# Patient Record
Sex: Female | Born: 1980
Health system: Southern US, Community
[De-identification: ages and names within clinical notes are randomized; demographics above are authoritative.]

## PROBLEM LIST (undated history)

## (undated) DIAGNOSIS — R87619 Unspecified abnormal cytological findings in specimens from cervix uteri: Secondary | ICD-10-CM

## (undated) DIAGNOSIS — B977 Papillomavirus as the cause of diseases classified elsewhere: Secondary | ICD-10-CM

## (undated) HISTORY — DX: Unspecified abnormal cytological findings in specimens from cervix uteri: R87.619

## (undated) HISTORY — DX: Papillomavirus as the cause of diseases classified elsewhere: B97.7

---

## 2004-01-11 ENCOUNTER — Emergency Department (HOSPITAL_COMMUNITY): Admission: EM | Admit: 2004-01-11 | Discharge: 2004-01-11 | Payer: Self-pay | Admitting: Emergency Medicine

## 2006-10-19 ENCOUNTER — Emergency Department (HOSPITAL_COMMUNITY): Admission: EM | Admit: 2006-10-19 | Discharge: 2006-10-19 | Payer: Self-pay | Admitting: Emergency Medicine

## 2007-06-27 ENCOUNTER — Other Ambulatory Visit: Admission: RE | Admit: 2007-06-27 | Discharge: 2007-06-27 | Payer: Self-pay | Admitting: Gynecology

## 2007-10-12 ENCOUNTER — Other Ambulatory Visit: Admission: RE | Admit: 2007-10-12 | Discharge: 2007-10-12 | Payer: Self-pay | Admitting: Gynecology

## 2007-11-18 DIAGNOSIS — B977 Papillomavirus as the cause of diseases classified elsewhere: Secondary | ICD-10-CM

## 2007-11-18 HISTORY — PX: GYNECOLOGIC CRYOSURGERY: SHX857

## 2007-11-18 HISTORY — DX: Papillomavirus as the cause of diseases classified elsewhere: B97.7

## 2008-02-22 ENCOUNTER — Other Ambulatory Visit: Admission: RE | Admit: 2008-02-22 | Discharge: 2008-02-22 | Payer: Self-pay | Admitting: Gynecology

## 2008-04-23 ENCOUNTER — Ambulatory Visit: Payer: Self-pay | Admitting: Gastroenterology

## 2008-04-23 DIAGNOSIS — K5900 Constipation, unspecified: Secondary | ICD-10-CM | POA: Insufficient documentation

## 2008-04-23 DIAGNOSIS — D649 Anemia, unspecified: Secondary | ICD-10-CM | POA: Insufficient documentation

## 2008-04-23 DIAGNOSIS — K625 Hemorrhage of anus and rectum: Secondary | ICD-10-CM | POA: Insufficient documentation

## 2008-04-24 LAB — CONVERTED CEMR LAB: TSH: 0.47 microintl units/mL (ref 0.35–5.50)

## 2008-07-03 ENCOUNTER — Encounter: Payer: Self-pay | Admitting: Gynecology

## 2008-07-03 ENCOUNTER — Other Ambulatory Visit: Admission: RE | Admit: 2008-07-03 | Discharge: 2008-07-03 | Payer: Self-pay | Admitting: Gynecology

## 2008-07-03 ENCOUNTER — Ambulatory Visit: Payer: Self-pay | Admitting: Gynecology

## 2008-07-10 ENCOUNTER — Ambulatory Visit: Payer: Self-pay | Admitting: Gynecology

## 2008-08-07 ENCOUNTER — Ambulatory Visit: Payer: Self-pay | Admitting: Gynecology

## 2008-10-02 ENCOUNTER — Ambulatory Visit: Payer: Self-pay | Admitting: Gynecology

## 2008-10-31 ENCOUNTER — Ambulatory Visit: Payer: Self-pay | Admitting: Gynecology

## 2008-11-02 ENCOUNTER — Encounter: Payer: Self-pay | Admitting: Gynecology

## 2008-11-02 ENCOUNTER — Ambulatory Visit: Payer: Self-pay | Admitting: Gynecology

## 2008-11-02 ENCOUNTER — Ambulatory Visit (HOSPITAL_BASED_OUTPATIENT_CLINIC_OR_DEPARTMENT_OTHER): Admission: RE | Admit: 2008-11-02 | Discharge: 2008-11-02 | Payer: Self-pay | Admitting: Gynecology

## 2008-11-02 HISTORY — PX: PELVIC LAPAROSCOPY: SHX162

## 2008-11-09 ENCOUNTER — Ambulatory Visit: Payer: Self-pay | Admitting: Gynecology

## 2009-03-12 ENCOUNTER — Ambulatory Visit: Payer: Self-pay | Admitting: Gynecology

## 2009-07-08 ENCOUNTER — Other Ambulatory Visit: Admission: RE | Admit: 2009-07-08 | Discharge: 2009-07-08 | Payer: Self-pay | Admitting: Gynecology

## 2009-07-08 ENCOUNTER — Encounter: Payer: Self-pay | Admitting: Gynecology

## 2009-07-08 ENCOUNTER — Ambulatory Visit: Payer: Self-pay | Admitting: Gynecology

## 2010-02-25 ENCOUNTER — Ambulatory Visit: Payer: Self-pay | Admitting: Gynecology

## 2010-02-27 ENCOUNTER — Ambulatory Visit (HOSPITAL_BASED_OUTPATIENT_CLINIC_OR_DEPARTMENT_OTHER): Admission: RE | Admit: 2010-02-27 | Discharge: 2010-02-27 | Payer: Self-pay | Admitting: Family Medicine

## 2010-02-27 ENCOUNTER — Ambulatory Visit: Payer: Self-pay | Admitting: Diagnostic Radiology

## 2010-02-28 ENCOUNTER — Ambulatory Visit: Payer: Self-pay | Admitting: Gynecology

## 2010-10-19 ENCOUNTER — Encounter: Payer: Self-pay | Admitting: Gynecology

## 2011-01-13 LAB — POCT PREGNANCY, URINE: Preg Test, Ur: NEGATIVE

## 2011-02-10 NOTE — Op Note (Signed)
Alisha Morris, Alisha Morris               ACCOUNT NO.:  [id number]   MEDICAL RECORD NO.:  [id number]          PATIENT TYPE:  AMB   LOCATION:  [facility]                         FACILITY:  [facility]   PHYSICIAN:  Juan H. Lily Peer, M.D.DATE OF BIRTH:  1981/09/22   DATE OF PROCEDURE:  11/02/2008  DATE OF DISCHARGE:                               OPERATIVE REPORT   SURGEON:  Juan H. Lily Peer, M.D.   INDICATION FOR OPERATION:  A 30 year old gravida 0 with chronic right  lower quadrant pain.  History of recurrent ovarian cyst, especially on  the right.   PREOPERATIVE DIAGNOSES:  1. Chronic right lower quadrant pain.  2. Ovarian cyst.   POSTOPERATIVE DIAGNOSES:  1. Chronic right lower quadrant pain.  2. Pelvic adhesion.  3. Retroverted uterus.   PROCEDURES PERFORMED:  1. Diagnostic laparoscopy.  2. Pelvic washing.  3. Lysis of right pelvic wall adhesions.   FINDINGS:  Right pelvic wall adhesion, normal right tube and ovary,  retroverted uterus, left ovary adhered posterior to the uterus with tube  slightly agglutinated, normal-appearing appendix, and normal smooth  liver surface and gallbladder.   DESCRIPTION OF OPERATION:  After the patient adequately counseled, she  was taken to the operating room, where she underwent a successful  general endotracheal anesthesia.  She was placed in low lithotomy  position and the abdomen was prepped and draped in usual sterile  fashion.  Pelvic examination demonstrated a retroverted uterus.  Hulka  tenaculum was placed for manipulation during the laparoscopic procedure  and a Foley catheter had been inserted to monitor urinary output.  After  the drapings were in place, a small stab incision was made in the  infraumbilical region and  the 10/11 Optiview trocar inserted into the  peritoneal cavity under direct visualization.  A pneumoperitoneum was  established by 2 liters of carbon dioxide to insufflate the peritoneal  cavity.  A 5-mm trocar was made 2 cm  above the symphysis pubis under  direct laparoscopic guidance.  A systematic inspection demonstrated the  above-mentioned findings.  With a blunt traction, the right pelvic side  wall adhesions were freed and it was noted that there was no evidence of  any persistence of any ovarian cyst, the fallopian tube with a large  fimbriated end, and no other abnormality was noted.  The left ovary was  found to be adhered to the pelvic side wall with slightly agglutinating  left distal fallopian tube; otherwise the anterior and posterior cul-de-  sac with no gross evidence of endometriotic implants, and pelvic washes  were obtained.  The appendix appeared to be normal in appearance grossly  as well as smooth liver surface and smooth liver gallbladder.  The  pictures were obtained.  The pneumoperitoneum was removed.  The  subumbilical fascia was closed with a figure-of-eight 0 Vicryl suture  and a subcuticular stitch of 3-0 Vicryl was used to bring the  subcutaneous tissue close together and 4-0 plain catgut was used  subcuticular to close the subumbilical incision as well as the  suprapubic incision.  Marcaine 0.25% was utilized  for postoperative analgesia for a  total 5 mL.  Hulka tenaculum was  removed.  The patient was extubated, transferred to recovery room with  stable vital signs.  Blood loss was minimal.  Fluid resuscitation  consisted of 1400 mL of lactated Ringer.  Urine output on in-and-out  catheter was 400 cc.      Juan H. Lily Peer, M.D.  Electronically Signed     JHF/MEDQ  D:  11/02/2008  T:  11/02/2008  Job:  [id number]

## 2011-02-10 NOTE — H&P (Signed)
[name], Alisha Morris               ACCOUNT NO.:  [id number]   MEDICAL RECORD NO.:  [id number]          PATIENT TYPE:  AMB   LOCATION:  [facility]                         FACILITY:  [facility]   PHYSICIAN:  Juan H. Lily Peer, M.D.DATE OF BIRTH:  28-Apr-1981   DATE OF ADMISSION:  DATE OF DISCHARGE:                              HISTORY & PHYSICAL    The patient is scheduled for surgery on Friday, February 5th, at 07:30  a.m. at [facility], please have history and physical  available.   CHIEF COMPLAINT:  Chronic right lower quadrant pain.   HISTORY OF PRESENT ILLNESS:  The patient is a 30 year old gravida 1,  para 1 who had been complaining of chronic right lower quadrant pain  since over a year.  She was originally scheduled to undergo a diagnostic  laparoscopy to rule out endometriosis several months ago, and she had  cancelled.  We had done an ultrasound back in the office in October  2009, she had a right ovarian thin wall diffuse homogeneous low-level  echo cyst measuring 13 mm x 9 mm x 13 mm negative color-flow Doppler  suggesting endometrioma of the left ovary if she had a left corpus  luteum cyst.  On November 2009 prior to her surgery, she had continued  the presence of the thin wall cyst measuring 15 mm x 14 mm, no change,  and echo pattern was positive color-flow in the left thin wall complex  cyst measured 23 mm x 31 mm with negative color flow Doppler.  She has  continued to have pain and decided to proceed with her surgery.  An  ultrasound was done in the office on January 5th for followup on the  right ovarian thin wall cyst thick mass with diffuse internal low-level  echoes, homogeneous echo pattern consistent with endometrioma, negative  color-flow Doppler and measured 28 mm x 19 mm x 12 mm.  The left ovary  was normal.   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   PAST SURGICAL HISTORY:  Prior surgeries consisted of cryotherapy of  her  cervix. She has had one vaginal delivery.   ALLERGIES:  She denies any allergies.   PHYSICAL EXAMINATION:  VITAL SIGNS:  The patient weighs 110 pounds and  blood pressure 116/78.  HEENT:  Unremarkable.  NECK:  Supple.  Trachea midline.  No carotid bruits.  No thyromegaly.  LUNGS:  Clear to auscultation without any rhonchi or wheezes.  HEART:  Regular rate and rhythm.  No murmurs or gallop.  BREASTS:  Exam not done.  ABDOMEN:  Soft and nontender.  No rebound or guarding.  PELVIC:  Bartholin, urethra, and Skene's are within normal limits.  VAGINA AND CERVIX:  No gross lesions on inspection.  UTERUS:  Anteverted.  Normal size, shape, and consistency.  ADNEXA:  With some tenderness in the right adnexa, otherwise  unremarkable.  RECTAL:  Exam not done.   ASSESSMENT:  A 30 year old gravida 1, para 1 with chronic right lower  quadrant pain.  Consistency on ultrasound appears to be that she has an  endometrioma which measures 20 mm x 19 mm x 12 mm.  She is scheduled to  undergo diagnostic laparoscopy with right ovarian cystectomy.  The  risks, benefits, and pros and cons of the operation were discussed in  Spanish to include the risk of infection although she will receive  prophylaxis antibiotic.  The risk of injury and trauma to internal  organs that will require open laparotomy or in the event that the  operation may not be completed laparoscopically an open laparotomy  technique may be needed to utilized which may add few extra days of her  hospitalizations.  All these issues were discussed with the patient in  Spanish and the worse case scenario if she would hemorrhage or need a  blood or blood transfusion.  She is fully aware of risks from donor to  recipient blood consisting of risk such as anaphylactic reaction,  hepatitis, and AIDS.  All these issues were discussed in Spanish with  the patient.  All questions were answered, and we will follow  accordingly.   PLAN:  The  patient is scheduled for diagnostic laparoscopy on Friday,  February 5th at 07:30 a.m. at [facility], please have  history and physical available.      Juan H. Lily Peer, M.D.  Electronically Signed     JHF/MEDQ  D:  10/31/2008  T:  10/31/2008  Job:  [id number]

## 2011-09-02 ENCOUNTER — Ambulatory Visit (INDEPENDENT_AMBULATORY_CARE_PROVIDER_SITE_OTHER): Payer: BC Managed Care – PPO

## 2011-09-02 DIAGNOSIS — R05 Cough: Secondary | ICD-10-CM

## 2011-09-02 DIAGNOSIS — J019 Acute sinusitis, unspecified: Secondary | ICD-10-CM

## 2011-09-02 DIAGNOSIS — R059 Cough, unspecified: Secondary | ICD-10-CM

## 2011-09-02 DIAGNOSIS — J069 Acute upper respiratory infection, unspecified: Secondary | ICD-10-CM

## 2012-05-26 ENCOUNTER — Ambulatory Visit (INDEPENDENT_AMBULATORY_CARE_PROVIDER_SITE_OTHER): Payer: Self-pay | Admitting: Gynecology

## 2012-05-26 ENCOUNTER — Other Ambulatory Visit (HOSPITAL_COMMUNITY)
Admission: RE | Admit: 2012-05-26 | Discharge: 2012-05-26 | Disposition: A | Discharge status: Home / Self Care | Payer: BC Managed Care – PPO | Source: Ambulatory Visit | Attending: Gynecology | Admitting: Gynecology

## 2012-05-26 ENCOUNTER — Encounter: Payer: Self-pay | Admitting: Gynecology

## 2012-05-26 VITALS — BP 120/72 | Ht 60.0 in | Wt 119.0 lb

## 2012-05-26 DIAGNOSIS — N898 Other specified noninflammatory disorders of vagina: Secondary | ICD-10-CM

## 2012-05-26 DIAGNOSIS — Z01419 Encounter for gynecological examination (general) (routine) without abnormal findings: Secondary | ICD-10-CM

## 2012-05-26 DIAGNOSIS — Z1151 Encounter for screening for human papillomavirus (HPV): Secondary | ICD-10-CM | POA: Insufficient documentation

## 2012-05-26 DIAGNOSIS — N946 Dysmenorrhea, unspecified: Secondary | ICD-10-CM

## 2012-05-26 DIAGNOSIS — L293 Anogenital pruritus, unspecified: Secondary | ICD-10-CM

## 2012-05-26 DIAGNOSIS — N870 Mild cervical dysplasia: Secondary | ICD-10-CM

## 2012-05-26 DIAGNOSIS — G8929 Other chronic pain: Secondary | ICD-10-CM

## 2012-05-26 DIAGNOSIS — R51 Headache: Secondary | ICD-10-CM

## 2012-05-26 LAB — CBC WITH DIFFERENTIAL/PLATELET
Eosinophils Absolute: 0 10*3/uL (ref 0.0–0.7)
Hemoglobin: 11.9 g/dL — ABNORMAL LOW (ref 12.0–15.0)
MCHC: 34.3 g/dL (ref 30.0–36.0)
Monocytes Absolute: 0.4 10*3/uL (ref 0.1–1.0)
RBC: 4.49 MIL/uL (ref 3.87–5.11)
RDW: 15.3 % (ref 11.5–15.5)
WBC: 4.1 10*3/uL (ref 4.0–10.5)

## 2012-05-26 MED ORDER — IBUPROFEN 800 MG PO TABS: 800.0000 mg | ORAL_TABLET | Freq: Three times a day (TID) | ORAL | Status: AC | PRN

## 2012-05-26 NOTE — Patient Instructions (Addendum)
Vacuna contra la difteria, el ttanos, y la tos ferina Lo que usted necesita saber (Diphtheria, Tetanus, and Pertussis [DTaP] Vaccine) POR QU VACUNARSE? La difteria, el ttano y la tos [place] son enfermedades graves provocadas por bacterias. La difteria y la tos [place]etta Spar se [place] de persona a Social worker. El ttano ingresa al cuerpo a travs de cortes o heridas. La difteria produce un recubrimiento denso en la parte trasera de la garganta.  Puede producir problemas para respirar, parlisis, insuficiencia cardaca, e incluso la muerte.  El ttanos causa contracturas dolorosas de los msculos, a menudo en todo el cuerpo.  Puede ocasionar un "bloqueo" de la [place], de modo que es imposible abrir la boca o tragar. El ttanos produce la muerte en alrededor de 2 cada 10 casos.  La tos ferina produce ataques de tos tan fuertes que, en nios, imposibilita comer, beber o respirar. Estos ataques pueden durar semanas.  Puede producir neumona, convulsiones (ataques de espasmos o ausencias), dao cerebral, y la muerte.  La vacuna para la difteria, el ttano y la tos Commodore (DTPa) puede ayudar a Market researcher. La mayor parte de los nios que la reciben estarn protegidos durante toda su niez. Muchos ms nios padeceran estas enfermedades si no fueran vacunados. La DTPa es una versin ms segura de una vacuna anterior denominada DTP. La DTP se ha dejado de [employer]. QUIN DEBE RECIBIR ESTA VACUNA Y CUNDO? Los nios deberan recibir 5 dosis de la vacuna DTPa, una dosis en cada una de las siguientes edades:  2 meses.   4 meses.   6 meses.   15 a 18 meses.   4 a 6 aos.  La vacuna DTPa puede darse en simultneo con otras vacunas. ALGUNOS NIOS NO DEBERAN DARSE LA VACUNA DTPA O DEBERAN ESPERAR  Aquellos nios con trastornos menores, tales como resfros, pueden ser vacunados, pero aquellos con trastornos moderados a graves deberan esperar hasta su recuperacin  para recibir la vacuna DTPa.   Cualquier nio que haya tenido una reaccin alrgica grave luego de una dosis de DTPa no debera recibir otra dosis.   Cualquier nio que haya sufrido una enfermedad cerebral o del sistema nervioso luego de [address] de haber recibido una dosis de la vacuna DTPa, no debera recibir otra dosis.   Hable con el mdico si el nio:   Ha tenido convulsiones o sufri un colapso luego de una dosis de DTPa.   Ha llorado sin parar durante 3 horas o ms luego de una dosis de DTPa.   Ha tenido fiebre mayor a 105 F (40.6 C) luego de una dosis de DTPa.   Pida ms informacin al profesional que lo asiste. Algunos de estos nios podrn recibir una vacuna que no protege para la tos [place], [place] DT.  NIOS DE MAYOR EDAD Y ADULTOS  La vacuna DTPa no se administra en adolescentes, adultos, o nios mayores a los 7 aos de [place].   Sin embargo, Therapist, art an requieren proteccin. Existe una vacuna llamada Tdap, que es similar a la DTPa. Se recomienda una dosis nica de Tdap en personas desde los 11 a los 64 aos de [place]. Otra vacuna, llamada Td, provee proteccin contra el ttanos y la difteria, pero no contra la tos [place]. Se recomienda su aplicacin cada 10 aos.  CULES SON LOS RIESGOS DE LA VACUNA DTPA?  Enfermarse de difteria, ttanos o pertusis es mucho ms peligroso que recibir la vacuna DTPa.   Sin embargo, una [place], como cualquier  otro medicamento, puede causar problemas serios, como Therapist, art graves. El riesgo de que la vacuna DTPa cause daos graves o la muerte es extremadamente pequeo.  Problemas leves (comunes)  Fiebre (en hasta 1 de cada 4 nios).   Enrojecimiento o inflamacin en el lugar en el que se dio la inyeccin (en hasta 1 de cada 4 nios).   Dolor o sensibilidad en Immunologist en el que se dio la inyeccin (en hasta 1 de cada 4 nios).  Estos problemas ocurren ms a menudo luego de la cuarta y [place] dosis de vacuna DTPa que en las  dosis anteriores. En ocasiones luego de la cuarta o quinta dosis se observa la inflamacin de la pierna o brazo completo en que se ha dado la inyeccin, y puede durar de 1 a 7 das (en hasta 1 nio de cada 30). Otros problemas leves incluyen:  Irritabilidad (en hasta 1 de cada 3 nios).   Cansancio o falta de apetito (en hasta 1 de cada 10 nios).   Vmitos (en hasta 1 de cada 50 nios).  Estos problemas ocurren generalmente de 1 a 3 das luego de la inyeccin. Problemas moderados (poco frecuentes)  Convulsiones (sacudones o fijacin de la mirada) (en hasta 1 de cada 14.000 nios).   Llanto sin parar durante 3 horas o ms (en hasta 1 nio de cada 1.000).   Fiebre alta, mayor a 105 F (40.6 C) (alrededor de 1 nio cada 16.000).  Problemas graves (muy raros)  Automotive engineer grave (menos de 1 por milln de dosis).   Se han informado varios otros problemas graves luego de la aplicacin de la vacuna DTPa. Estos incluyen:   Convulsiones a largo plazo, coma, o reduccin de la conciencia.   Dao permanente al cerebro.  Estos son casos tan poco frecuentes que resulta difcil saber si fueron provocados por la vacuna. Controlar la fiebre es particularmente importante para los nios que han tenido convulsiones, por cualquier motivo. Tambin es importante si otro miembro de la familia ha tenido convulsiones. Puede reducir la fiebre y el dolor dando al nio un analgsico sin aspirina al recibir la vacuna, y durante las siguientes 24 horas, segn las instrucciones del [place]. QU PASA SI HAY UNA REACCIN MODERADA O GRAVE? A qu debo prestar atencin? Cualquier cosa extraa o poco comn, como una reaccin alrgica, fiebre alta o comportamiento extrao. Es muy poco comn que ocurran reacciones alrgicas graves con cualquier vacuna. Si se produjera una, sera dentro de los primeros minutos hasta algunas horas luego de la inyeccin. Podr observar dificultad para respirar, ronquera o silbidos al  respirar, ronchas, palidez, debilidad, frecuencia cardaca elevada, o mareos. Si ocurrieran fiebre o convulsiones, normalmente sera dentro de la primera semana luego de la inyeccin. Qu debo hacer?  Comunquese con el mdico o lleve inmediatamente a la persona a un mdico.   Diga al mdico lo que ocurri, la fecha y hora en que ocurri, y cundo recibi la vacuna.   Pida al mdico, enfermera, o al servicio de salud que complete el informe [employer] efectos adversos de la vacuna (Vaccine Adverse Event Reporting System, VAERS). O, bien puede completar el informe a travs del sitio web de VAERS en www.vaers.LAgents.no o llamando al [phone].  VAERS no proporciona consejos mdicos. EL PROGRAMA NACIONAL DE COMPENSACIN POR LESIONES CAUSADAS POR VACUNAS (NATIONAL VACCINE INJURY COMPENSATION PROGRAM)  En el raro caso en que usted o su hijo hayan tenido una reaccin grave a Cathleen Corti, se ha creado Community education officer  para ayudarlo a Network engineer atencin de los lesionados.   Para obtener detalles acerca del [place] de Compensacin por Lesiones Causadas por [place], llame al [phone] o visite el sitio web del programa en SpiritualWord.at  CMO OBTENER MS INFORMACIN?  Consulte con el profesional que lo asiste. Podr darle el prospecto de la vacuna o sugerirle otras fuentes de informacin.   Llame al programa de vacunacin del departamento de salud local o estatal.   Comunquese con los Centers for [employer] and Prevention (Centros para el control y la prevencin de enfermedades, CDC).   Llame al [phone] (1-800-CDC-INFO).   Visite el sitio web del [employer] de [place], en PicCapture.uy  CDC Diphtheria, Tetanus, and Pertussis-Spanish VIS (02/11/06) Document Released: 12/11/2008 Document Revised: 09/03/2011 [facility] Patient Information 2012 [place], [place].  Auto examen de mama (Breast Self-Exam) El auto examen puede ayudarla a  encontrar modificaciones o trastornos en la mama cuando todava son pequeos. Haga el auto examen de la mama:  [employer].   Una semana despus de su perodo (ciclo menstrual o periodo menstrual).   El Social worker de cada mes, si ya no tiene el perodo.  Debe estar atenta a:  Cambios en el color, tamao o forma de la mama.   Hoyuelos en los senos.   Modificaciones en los pezones o la piel.   Sequedad en la piel de los senos o los pezones.   Secreciones acuosas o sanguinolentas en los pezones.  Trate de sentir la presencia de:  Bultos.   Durezas.   Cualquier otro cambio.  CUIDADOS EN EL HOGAR  Hay 3 formas de hacer el examen autoexamen de mama: Prese frente a un espejo.  Levante los brazos por arriba de la cabeza y gire de un lado al otro.   Coloque las [employer] caderas e inclnese hacia abajo y luego gire de un lado al otro.   Inclnese hacia delante y gire de un lado al otro.  En la ducha.  Con las manos enjabonadas, revise los [employer]. Luego controle por [place] y por debajo de la clavcula y las [place].   Pase los dedos por la zona superior e inferior de la clavcula hasta debajo del seno, y desde el centro del pecho hasta el borde exterior de Management consultant. Controle si hay bultos o zonas duras.   Utilizando las yemas de tres dedos del medio revise todo su seno presionando la mano sobre el [place], haciendo crculos o movimientos hacia arriba y [place].  Acostada.  Acustese sobre su cama.   Coloque una almohada pequea debajo de la mama que va a Chief Operating Officer. En esa misma posicin, ponga la mano detrs de la cabeza.   Con la Yahoo, use los 3 dedos del medio para Public house manager.   Mueva los dedos en un crculo alrededor de la mama. Presione firmemente sobre todas las partes de la mama para Engineer, manufacturing bultos.  SOLICITE AYUDA DE INMEDIATO SI: Encuentra algn cambio para que puedan realizarle un estudio. Document Released: 10/17/2010 Document Revised:  09/03/2011 [facility] Patient Information 2012 [place], [place].

## 2012-05-27 ENCOUNTER — Other Ambulatory Visit: Payer: Self-pay | Admitting: Anesthesiology

## 2012-05-27 ENCOUNTER — Encounter: Payer: Self-pay | Admitting: Gynecology

## 2012-05-27 DIAGNOSIS — R739 Hyperglycemia, unspecified: Secondary | ICD-10-CM

## 2012-05-27 NOTE — Progress Notes (Signed)
Alisha Morris 25-Dec-1980 [id number]   History:    31 y.o.  for annual gyn exam who has not been seen in the office for several years. Review of patient's records indicated that in 2009 she had CIN-1 with high-risk HPV detected. She subsequently underwent cryotherapy of her cervix. She also had a diagnostic laparoscopy in 2010 for chronic pelvic pain and no endometriosis was identified she in frequently does her self breast examination and currently not using any form of contraception recently she had vulvar pruritus that resolved with Monistat. Her last Pap smear was in 2010 which was negative. She suffers at time from chronic headaches but works in the loud environment and refuses to wear protective pure pieces. She does not recall ever having the dTap Vaccine.  Past medical history,surgical history, family history and social history were all reviewed and documented in the EPIC chart.  Gynecologic History Patient's last menstrual period was 05/25/2012. Contraception: none Last Pap: 2000 and. Results were: normal Last mammogram: Not indicated. Results were: Not indicated  Obstetric History OB History    Grav Para Term Preterm Abortions TAB SAB Ect Mult Living   1 1 1       1      # Outc Date GA Lbr Len/2nd Wgt Sex Del Anes PTL Lv   1 TRM     M SVD  No Yes       ROS: A ROS was performed and pertinent positives and negatives are included in the history.  GENERAL: No fevers or chills. HEENT: No change in vision, no earache, sore throat or sinus congestion. NECK: No pain or stiffness. CARDIOVASCULAR: No chest pain or pressure. No palpitations. PULMONARY: No shortness of breath, cough or wheeze. GASTROINTESTINAL: No abdominal pain, nausea, vomiting or diarrhea, melena or bright red blood per rectum. GENITOURINARY: No urinary frequency, urgency, hesitancy or dysuria. MUSCULOSKELETAL: No joint or muscle pain, no back pain, no recent trauma. DERMATOLOGIC: No rash, no itching, no lesions. ENDOCRINE: No  polyuria, polydipsia, no heat or cold intolerance. No recent change in weight. HEMATOLOGICAL: No anemia or easy bruising or bleeding. NEUROLOGIC: No headache, seizures, numbness, tingling or weakness. PSYCHIATRIC: No depression, no loss of interest in normal activity or change in sleep pattern.     Exam: chaperone present  BP 120/72  Ht 5' (1.524 m)  Wt 119 lb (53.978 kg)  BMI 23.24 kg/m2  LMP 05/25/2012  Body mass index is 23.24 kg/(m^2).  General appearance : Well developed well nourished female. No acute distress HEENT: Neck supple, trachea midline, no carotid bruits, no thyroidmegaly Lungs: Clear to auscultation, no rhonchi or wheezes, or rib retractions  Heart: Regular rate and rhythm, no murmurs or gallops Breast:Examined in sitting and supine position were symmetrical in appearance, no palpable masses or tenderness,  no skin retraction, no nipple inversion, no nipple discharge, no skin discoloration, no axillary or supraclavicular lymphadenopathy Abdomen: no palpable masses or tenderness, no rebound or guarding Extremities: no edema or skin discoloration or tenderness  Pelvic:  Bartholin, Urethra, Skene Glands: Within normal limits             Vagina: No gross lesions or discharge  Cervix: No gross lesions or discharge  Uterus  anteverted, normal size, shape and consistency, non-tender and mobile  Adnexa  Without masses or tenderness  Anus and perineum  normal   Rectovaginal  normal sphincter tone without palpated masses or tenderness             Hemoccult not provided  Assessment/Plan:  31 y.o. female for annual exam who was counseled on the importance of self breast examination. Her Pap smear along with CBC, screening cholesterol was obtained today. Patient is on day 2 of her menstrual cycle today. Literature information on the dTap Vaccine was provided for the patient to decide and come back to have administered. We stressed on her the importance of wearing protective  gear piece to eliminate her headaches and after doing so she continues with headache she will need to be referred to a neurologist for further evaluation.    Ok Edwards MD, 8:22 AM 05/27/2012

## 2012-06-01 ENCOUNTER — Other Ambulatory Visit: Payer: BC Managed Care – PPO

## 2012-06-01 DIAGNOSIS — R739 Hyperglycemia, unspecified: Secondary | ICD-10-CM

## 2012-06-01 LAB — GLUCOSE, RANDOM: Glucose, Bld: 97 mg/dL (ref 70–99)

## 2012-08-01 ENCOUNTER — Ambulatory Visit (INDEPENDENT_AMBULATORY_CARE_PROVIDER_SITE_OTHER): Payer: BC Managed Care – PPO | Admitting: Gynecology

## 2012-08-01 ENCOUNTER — Encounter: Payer: Self-pay | Admitting: Gynecology

## 2012-08-01 VITALS — BP 110/78

## 2012-08-01 DIAGNOSIS — Z113 Encounter for screening for infections with a predominantly sexual mode of transmission: Secondary | ICD-10-CM

## 2012-08-01 DIAGNOSIS — N898 Other specified noninflammatory disorders of vagina: Secondary | ICD-10-CM

## 2012-08-01 LAB — WET PREP FOR TRICH, YEAST, CLUE: Clue Cells Wet Prep HPF POC: NONE SEEN

## 2012-08-01 MED ORDER — FLUCONAZOLE 100 MG PO TABS: ORAL_TABLET | ORAL | Status: DC

## 2012-08-01 NOTE — Patient Instructions (Signed)
Vulvovaginitis Candidisica (Candidal Vulvovaginitis) La vulvovaginitis candidisica es una infeccin de la vagina y la vulva. La vulva es la piel que rodea la abertura de la vagina. Puede causar picazn y molestias dentro de y alrededor de la vagina.  CUIDADOS EN EL HOGAR  Slo tome medicamentos como lo indique su mdico.  No mantenga relaciones sexuales hasta que la infeccin haya curado o segn le indique el mdico.  Practique sexo seguro.  Informe a su compaero sexual acerca de su infeccin.  No tome duchas vaginales ni use tampones.  Use ropa interior de algodn. No utilice pantalones ni pantimedias ajustados.  Coma yogur. Esto puede ayudar a tratar y prevenir las infecciones por cndida. SOLICITE AYUDA DE INMEDIATO SI:   Tiene fiebre.  Los problemas empeoran durante el tratamiento, o si no mejora luego de 3 das.  Tiene malestar, irritacin, o picazn en la zona de la vagina o la vulva.  Siente dolor en al mantener relaciones sexuales.  Comienza a sentir dolor abdominal. ASEGRESE DE QUE:  Comprende estas instrucciones.  Controlar su enfermedad.  Solicitar ayuda de inmediato si no mejora o empeora. Document Released: 10/17/2010 Document Revised: 12/07/2011 [employer] Patient Information 2013 [employer].   

## 2012-08-01 NOTE — Progress Notes (Signed)
Patient presented to the office today complaining of a thick white discharge. She has a new sexual partner over the past 4 months. She's not using any form of contraception. Her menstrual cycles are regular. Her cycle was approximately 2 weeks ago.  Exam: Bartholin urethra Skene was within normal limits Vagina: Thick white discharge was evident Cervix: Follow but no lesions or discharge Uterus: Not examined Adnexa: Not examined rectal exam: Not done  Wet prep moderate amount of yeast, few WBC and moderate bacteria  GC and Chlamydia culture pending at time of this dictation  Assessment/plan: Clinical evidence of vulvovaginitis attributed to moniliasis. Patient be placed on Diflucan 100 mg one by mouth today and may repeat a second dose in 72 hours. Will notify her if there is any abnormality on the GC and Chlamydia culture as well as the following labs that were ordered today to complete the STD screen: HIV, RPR, hepatitis B and C. We had discussed about using a probiotic gel twice a week to prevent recurrence of BV and moniliasis. Patient was offered flu vaccine and declined.

## 2012-08-02 LAB — HEPATITIS B SURFACE ANTIGEN: Hepatitis B Surface Ag: NEGATIVE

## 2012-08-02 LAB — GC/CHLAMYDIA PROBE AMP, GENITAL: GC Probe Amp, Genital: NEGATIVE

## 2012-08-02 LAB — HEPATITIS C ANTIBODY: HCV Ab: NEGATIVE

## 2012-08-20 ENCOUNTER — Ambulatory Visit (INDEPENDENT_AMBULATORY_CARE_PROVIDER_SITE_OTHER): Payer: BC Managed Care – PPO | Admitting: Family Medicine

## 2012-08-20 VITALS — BP 122/67 | HR 81 | Temp 97.9°F | Resp 16 | Ht 59.0 in | Wt 117.0 lb

## 2012-08-20 DIAGNOSIS — Z0000 Encounter for general adult medical examination without abnormal findings: Secondary | ICD-10-CM

## 2012-08-20 LAB — COMPREHENSIVE METABOLIC PANEL
ALT: 21 U/L (ref 0–35)
Alkaline Phosphatase: 49 U/L (ref 39–117)
BUN: 12 mg/dL (ref 6–23)
CO2: 26 mEq/L (ref 19–32)
Calcium: 9.4 mg/dL (ref 8.4–10.5)
Chloride: 105 mEq/L (ref 96–112)
Creat: 0.76 mg/dL (ref 0.50–1.10)
Potassium: 4 mEq/L (ref 3.5–5.3)
Sodium: 139 mEq/L (ref 135–145)
Total Protein: 6.8 g/dL (ref 6.0–8.3)

## 2012-08-20 LAB — POCT CBC
Hemoglobin: 10.6 g/dL — AB (ref 12.2–16.2)
MCHC: 31 g/dL — AB (ref 31.8–35.4)
MID (cbc): 0.2 (ref 0–0.9)
POC LYMPH PERCENT: 33.6 %L (ref 10–50)
Platelet Count, POC: 265 10*3/uL (ref 142–424)
RDW, POC: 14.8 %
WBC: 3.3 10*3/uL — AB (ref 4.6–10.2)

## 2012-08-20 LAB — POCT GLYCOSYLATED HEMOGLOBIN (HGB A1C): Hemoglobin A1C: 5.6

## 2012-08-20 LAB — TSH: TSH: 0.707 u[IU]/mL (ref 0.350–4.500)

## 2012-08-20 NOTE — Progress Notes (Signed)
Subjective:    Patient ID: Alisha Morris, female    DOB: 1981-07-27, 31 y.o.   MRN: [id number] Chief Complaint  Patient presents with  . Annual Exam    no pap   HPI Went to dentist and got anesthetized and eye on that side became blurry and she couldn't see.  It resolved quickly and she has had no problems since but she was recommended to come in for a complete eval to ensure no other underlying medical problems.  Has a h/o abnormal paps followed by her gynecologist.  Past Medical History  Diagnosis Date  . 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 fluconazole (DIFLUCAN) 100 MG tablet Take one tablet and may repeat in 72 hours prn  2 tablet  0   No current facility-administered medications on file prior to visit.   No Known Allergies Past Surgical History  Procedure Laterality Date  . Gynecologic cryosurgery  11/18/2007  . Pelvic laparoscopy  11/02/08   History reviewed. No pertinent family history. History   Social History  . Marital Status: Married    Spouse Name: N/A    Number of Children: N/A  . Years of Education: N/A   Social History Main Topics  . Smoking status: Never Smoker   . Smokeless tobacco: Never Used  . Alcohol Use: No  . Drug Use: No  . Sexually Active: Yes    Birth Control/ Protection: None   Other Topics Concern  . None   Social History Narrative  . None     Review of Systems  Constitutional: Negative.   HENT: Negative.   Eyes: Positive for visual disturbance.  Respiratory: Negative.   Cardiovascular: Negative.   Gastrointestinal: Negative.   Endocrine: Negative.   Genitourinary: Negative.   Musculoskeletal: Negative.   Skin: Negative.   Allergic/Immunologic: Negative.   Neurological: Negative.   Hematological: Negative.   Psychiatric/Behavioral: Negative.   All other systems reviewed and are negative.      BP 122/67  Pulse 81   Temp(Src) 97.9 F (36.6 C) (Oral)  Resp 16  Ht 4\' 11"  (1.499 m)  Wt 117 lb (53.071 kg)  BMI 23.62 kg/m2  SpO2 100%  LMP 07/08/2012 Objective:   Physical Exam  Constitutional: She is oriented to person, place, and time. She appears well-developed and well-nourished. No distress.  HENT:  Head: Normocephalic and atraumatic.  Right Ear: Tympanic membrane, external ear and ear canal normal.  Left Ear: Tympanic membrane, external ear and ear canal normal.  Nose: Nose normal. No mucosal edema or rhinorrhea.  Mouth/Throat: Uvula is midline, oropharynx is clear and moist and mucous membranes are normal. No posterior oropharyngeal erythema.  Eyes: Conjunctivae and EOM are normal. Pupils are equal, round, and reactive to light. Right eye exhibits no discharge. Left eye exhibits no discharge. No scleral icterus.  Neck: Normal range of motion. Neck supple. No thyromegaly present.  Cardiovascular: Normal rate, regular rhythm, normal heart sounds and intact distal pulses.   Pulmonary/Chest: Effort normal and breath sounds normal. No respiratory distress.  Abdominal: Soft. Bowel sounds are normal. There is no tenderness.  Musculoskeletal: She exhibits no edema.  Lymphadenopathy:    She has no cervical adenopathy.  Neurological: She is alert and oriented to person, place, and time. She has normal reflexes.  Skin: Skin is warm and dry. She is not diaphoretic. No erythema.  Psychiatric: She has a normal  mood and affect. Her behavior is normal.      Assessment & Plan:  Routine general medical examination at a health care facility - Plan: POCT CBC, POCT glycosylated hemoglobin (Hb A1C), Comprehensive metabolic panel, TSH

## 2012-08-20 NOTE — Progress Notes (Signed)
Pt request copies  of the lab results are to be mailed to her residence and to her dentist: J.Selig Cooper  [address]  [address], [place] [address] Office: [phone]  Fax: [phone].

## 2012-08-20 NOTE — Progress Notes (Deleted)
  Subjective:    Patient ID: Alisha Morris, female    DOB: [birth year], 31 y.o.   MRN: [id number]  HPI    Review of Systems     Objective:   Physical Exam        Assessment & Plan:

## 2012-08-24 ENCOUNTER — Institutional Professional Consult (permissible substitution): Payer: BC Managed Care – PPO | Admitting: Gynecology

## 2012-08-29 ENCOUNTER — Encounter: Payer: Self-pay | Admitting: Gynecology

## 2012-08-29 ENCOUNTER — Ambulatory Visit (INDEPENDENT_AMBULATORY_CARE_PROVIDER_SITE_OTHER): Payer: BC Managed Care – PPO | Admitting: Gynecology

## 2012-08-29 VITALS — BP 120/80

## 2012-08-29 DIAGNOSIS — R102 Pelvic and perineal pain: Secondary | ICD-10-CM

## 2012-08-29 DIAGNOSIS — N898 Other specified noninflammatory disorders of vagina: Secondary | ICD-10-CM

## 2012-08-29 DIAGNOSIS — B373 Candidiasis of vulva and vagina: Secondary | ICD-10-CM

## 2012-08-29 DIAGNOSIS — N949 Unspecified condition associated with female genital organs and menstrual cycle: Secondary | ICD-10-CM

## 2012-08-29 DIAGNOSIS — L293 Anogenital pruritus, unspecified: Secondary | ICD-10-CM

## 2012-08-29 LAB — URINALYSIS W MICROSCOPIC + REFLEX CULTURE
Hgb urine dipstick: NEGATIVE
Protein, ur: NEGATIVE mg/dL

## 2012-08-29 LAB — PREGNANCY, URINE: Preg Test, Ur: NEGATIVE

## 2012-08-29 LAB — WET PREP FOR TRICH, YEAST, CLUE: Clue Cells Wet Prep HPF POC: NONE SEEN

## 2012-08-29 MED ORDER — TERCONAZOLE 0.4 % VA CREA: TOPICAL_CREAM | VAGINAL | Status: DC

## 2012-08-29 MED ORDER — TERCONAZOLE 0.4 % VA CREA: 1.0000 | TOPICAL_CREAM | Freq: Every day | VAGINAL | Status: DC

## 2012-08-29 NOTE — Patient Instructions (Addendum)
Vulvovaginitis Candidisica (Candidal Vulvovaginitis) La vulvovaginitis candidisica es una infeccin de la vagina y la vulva. La vulva es la piel que rodea la abertura de la vagina. Puede causar picazn y molestias dentro de y alrededor de la vagina.  CUIDADOS EN EL HOGAR  Slo tome medicamentos como lo indique su mdico.  No mantenga relaciones sexuales hasta que la infeccin haya curado o segn le indique el mdico.  Practique sexo seguro.  Informe a su compaero sexual acerca de su infeccin.  No tome duchas vaginales ni use tampones.  Use ropa interior de algodn. No utilice pantalones ni pantimedias ajustados.  Coma yogur. Esto puede ayudar a tratar y prevenir las infecciones por cndida. SOLICITE AYUDA DE INMEDIATO SI:   Tiene fiebre.  Los problemas empeoran durante el tratamiento, o si no mejora luego de 3 das.  Tiene malestar, irritacin, o picazn en la zona de la vagina o la vulva.  Siente dolor en al mantener relaciones sexuales.  Comienza a sentir dolor abdominal. ASEGRESE DE QUE:  Comprende estas instrucciones.  Controlar su enfermedad.  Solicitar ayuda de inmediato si no mejora o empeora. Document Released: 10/17/2010 Document Revised: 12/07/2011 ExitCare Patient Information 2013 [employer].   

## 2012-08-29 NOTE — Addendum Note (Signed)
Addended by: Ok Edwards on: 08/29/2012 08:33 PM   Modules accepted: Level of Service

## 2012-08-29 NOTE — Progress Notes (Signed)
Patient presented to the office today complaining of vaginal pruritus and burning in discharge. She also is complaining of bloating and right lower quadrant pain on and off for the past several months. She is sexually active and not using any contraception. She stated that her last menstrual period was normal on 08/08/2012. Patient was previously seen in the office on November 4 complaining of vaginal discharge had a new sexual partner in a full STD screen was done which was normal. She was treated for monilial vulvovaginitis with Diflucan and to repeat a second dose is 72 hours. She returned today with similar complaints. She does state that she does consume large quantities of sweets during the day. This right lower quadrant discomfort started 2 months ago and is nonspecific to any time of the month. She describes the she's had dyspareunia. Her menstrual cycles have been reported to be regular.  Exam: Abdomen: Soft slight tenderness in right lower quadrant but no rebound or guarding Pelvic: Bartholin urethra Skene was within normal limits Vagina: White discharge was noted Cervix: No lesions or discharge Bimanual exam: Limited due to patient's vaginismus and tenderness in the right lower quadrant Rectal exam: Not done  Urine pregnancy test negative Urinalysis negative A wet prep moderate yeast  Assessment/plan: Problem #1 for recurrent moniliasis and she will be started on Terazol 7 to apply each bedtime for one week vaginally. The second week she will skip a week with no treatment. The third week and she'll apply Terazol each bedtime for 7 days. The fourth week and thereafter for 6 months she'll apply the Terazol vaginal cream one day per week. If she were later on  her menses or if she were to conceive unexpectedly she should stop the medication. Problem #2 right lower quadrant pains limited pelvic exam patient will return to the office next week for complete ultrasound of the pelvis.

## 2012-09-08 ENCOUNTER — Other Ambulatory Visit: Payer: BC Managed Care – PPO

## 2012-09-08 ENCOUNTER — Ambulatory Visit: Payer: BC Managed Care – PPO | Admitting: Gynecology

## 2012-09-09 ENCOUNTER — Ambulatory Visit (INDEPENDENT_AMBULATORY_CARE_PROVIDER_SITE_OTHER): Payer: BC Managed Care – PPO

## 2012-09-09 ENCOUNTER — Encounter: Payer: Self-pay | Admitting: Gynecology

## 2012-09-09 ENCOUNTER — Ambulatory Visit (INDEPENDENT_AMBULATORY_CARE_PROVIDER_SITE_OTHER): Payer: BC Managed Care – PPO | Admitting: Gynecology

## 2012-09-09 VITALS — BP 112/70

## 2012-09-09 DIAGNOSIS — R1031 Right lower quadrant pain: Secondary | ICD-10-CM

## 2012-09-09 DIAGNOSIS — N949 Unspecified condition associated with female genital organs and menstrual cycle: Secondary | ICD-10-CM

## 2012-09-09 DIAGNOSIS — D259 Leiomyoma of uterus, unspecified: Secondary | ICD-10-CM

## 2012-09-09 DIAGNOSIS — R102 Pelvic and perineal pain: Secondary | ICD-10-CM

## 2012-09-09 DIAGNOSIS — G8929 Other chronic pain: Secondary | ICD-10-CM

## 2012-09-09 NOTE — Progress Notes (Signed)
Patient presented to the office for followup ultrasound. She was seen the office on December 2 cyst E. encounter note for detail. Patient with past history of recurrent vulvovaginitis and is currently on Terazol vaginal cream q. weekly for 6 months. She states that she feels bloated at times before her cycles and she has experienced dyspareunia as well. She was here today to discuss results of the ultrasound.  Ultrasound demonstrated uterus measures 7.4 x 7.0 x 4.6 cm with endometrial stripe of 4.2 mm. Retroverted uterus with a probable hyperechoic, vascular fibroid measuring 2.5 x 2.8 x 3.1 cm. Ovaries otherwise appeared normal.  The above results were discussed with the patient. Literature information was provided on diagnostic laparoscopy as well as on endometriosis in Spanish. She will finish her treatment for her chronic vulvovaginitis. At the above symptoms continue we'll proceed with scheduling surgery accordingly. Recent urinalysis was negative.

## 2012-09-09 NOTE — Patient Instructions (Addendum)
Laparoscopa diagnstica (Diagnostic Laparoscopy) La laparoscopa es un procedimiento quirrgico relativamente simple, de uso habitual y breve (menos de una hora) que se lleva a cabo para diagnosticar y tratar enfermedades del abdomen. El laparoscopio (tubo delgado, que emite luz, del tamao de un lpiz y similar a un telescopio) se inserta en el abdomen a travs de una pequea incisin (corte realizado por un cirujano). A travs de este instrumento, el profesional podr observar [employer] rganos del interior del abdomen (vientre) y ver si hay algo anormal. La laparoscopa podr llevarse a cabo tanto en el hospital como en un consultorio. Podrn administrarle un sedante suave que lo ayudar a relajarse antes y durante el procedimiento. Una vez en la sala de operaciones, le administrarn una anestesia general (a menos que usted y el profesional elijan otro tipo de anestesia). Despus de la laparoscopa, que generalmente dura menos de Leone Brand, ser [employer] sala de recuperacin durante algunas horas. Cuando regrese a Medical illustrator, la Hotel manager [employer] y [place]. RIESGOS Y COMPLICACIONES Comparados con los beneficios, los riesgos de la laparoscopia son relativamente pocos. El Management consultant con usted los riesgos antes del procedimiento. Algunos problemas que pueden ocurrir luego de la intervencin son:  Infecciones.  Hemorragias.  Puede ocurrir que se lesionen otros rganos.  Efectos secundarios de Architect. PROCEDIMIENTO Una vez que se encuentra anestesiado, el cirujano insufla el abdomen con un gas inofensivo (dixido de carbono) para Museum/gallery exhibitions officer observacin de los rganos de la pelvis. El cirujano introduce el laparoscopio a travs de una pequea incisin en el ombligo o alrededor del mismo. Podr insertar otros instrumentos, como una sonda para mover los rganos o Optometrist algn procedimiento a travs de otra pequea incisin.  En ocasiones se  toma una biopsia (muestra de tejido) para un diagnstico ms preciso o para Conservator, museum/gallery. La biopsia consiste en tomar una pequea muestra de tejido durante la laparoscopia para enviarlo al patlogo (especialista en la observacin de clulas y muestras de tejido) y que lo examine en el microscopio para un diagnstico a nivel de los tejidos. DESPUES DEL PROCEDIMIENTO  Se libera el gas del abdomen.  Las incisiones se cierran con puntos (suturas). Debido a que las incisiones son pequeas (generalmente de menos de 1 cm) las molestias son mnimas luego del procedimiento. Es posible que sienta cierto Loss adjuster, chartered. Es consecuencia de Programmer, systems del tubo mientras se encontraba dormido. Es posible que sienta algn dolor abdominal no muy intenso. Tambin podr sentir [employer] en las incisiones realizadas para insertar los instrumentos en el abdomen.  El tiempo de recuperacin es reducido, siempre que no haya habido complicaciones.  Har reposo en la sala de recuperacin hasta que se encuentre estable y se sienta bien. Si no aparecen complicaciones, podr regresar a su casa. AVERIGE LOS RESULTADOS DE SU ANLISIS Durante su visita no contar con todos los [employer]. En este caso, tenga otra entrevista con su mdico para conocerlos. No piense que el resultado es normal si no tiene noticias de su mdico o de la institucin mdica. Es Building services engineer seguimiento de todos los [place] de [place]. INSTRUCCIONES [place] todos los medicamentos tal como se le indic.  Utilice los medicamentos de venta libre o de prescripcin para Conservation officer, historic buildings, Health and safety inspector o la [place], segn se lo indique el profesional que lo asiste.  Reanude las actividades habituales cuando se le indique.  Es preferible que se duche  y no tome baos de inmersin.  Reanude la actividad sexual luego de Junior, o cuando lo autoricen.  No conduzca mientras se encuentre  bajo los efectos de narcticos. SOLICITE ATENCIN MDICA SI:  Siente un dolor abdominal inexplicable.  Siente dolor en los hombros, en la regin de los tirantes.  Si se siente aturdido o siente que se va a Artist.  Siente escalofros.  Usted o su nio tienen una temperatura oral de ms de 102 F (38.9 C).  Observa un drenaje purulento (similar al pus) que proviene de alguna de las heridas.  Usted o su hijo no puede realizar movimientos intestinales o evacuar gases.  Usted o su hijo sufren nuseas o vmitos. EST SEGURO QUE:   Comprende las instrucciones para el alta mdica.  Controlar su enfermedad.  Solicitar atencin mdica de inmediato segn las indicaciones. Document Released: 09/14/2005 Document Revised: 12/07/2011  Fibromas (Fibroids) Los fibromas son bultos (tumores) que pueden Conservation officer, nature del cuerpo de Nurse, mental health. Estos tumores no son cancerosos. Pueden variar en tamao, peso y lugar en el que crecen. CUIDADOS EN EL HOGAR  No tome aspirina.  Anote el nmero de apsitos o tampones que [place] durante el perodo. Infrmelo a su mdico. Esto puede ayudar a determinar el mejor tratamiento para usted. SOLICITE AYUDA DE INMEDIATO SI:  Siente dolor en la zona inferior del vientre (abdomen) y no se alivia con analgsicos.  Tiene clicos que no se calman con medicamentos  Aumenta el sangrado entre perodos o durante el mismo.  Sufre mareos o se desvanece (se desmaya).  El dolor en el vientre [place]. ASEGRESE DE QUE:  Comprende estas instrucciones.  Controlar su enfermedad.  Solicitar ayuda de inmediato si no mejora o empeora. Document Released: 12/30/2010 Document Revised: 12/07/2011 [facility] Patient Information 2013 [place], [place].  ExitCare Patient Information 2013 [place], [place].Endometriosis (Endometriosis) La endometriosis es un trastorno que se produce cuando existen porciones de endometrio (el recubrimiento del tero) fuera de su  ubicacin normal. Puede ocurrir en muchos lugares prximos al tero (matriz), pero generalmente se produce en los ovarios, en las trompas de [place], en la vagina (canal de parto) y en los intestinos que se encuentran cerca del tero. Debido a que el tero elimina (expulsa) su recubrimiento todos los meses (menstruacin), hay una Cabin crew en el que el tejido endometrial est ubicado. SNTOMAS Generalmente no se presentan sntomas (problemas); sin embargo, debido a que la sangre irrita los tejidos que normalmente no estn expuestos a ella, cuando se producen los sntomas, estos varan segn Immunologist al cual se haya desplazado el endometrio. Entre los sntomas se incluyen el dolor de espalda y el dolor abdominal (vientre). Los perodos pueden ser ms abundantes y las relaciones sexuales dolorosas. Puede haber infertilidad. Usted podr sufrir todos estos sntomas al [employer] o no, o puede haber meses en los que no tenga ningn sntoma. Aunque los sntomas se producen principalmente durante las menstruaciones, tambin pueden aparecer en la mitad del ciclo y generalmente terminan con la menopausia. DIAGNSTICO El profesional que la asiste podr indicarle un examen de sangre y de [place] para descartar otros trastornos. Otra prueba frecuente en estos casos es el [place], un procedimiento indoloro que utiliza ondas de sonido para hacer una ecografa del tejido anormal que indica endometriosis. Si siente dolor al mover el intestino durante el perodo, el profesional que la asiste podr indicarle un enema de bario (radiografa de la parte inferior del intestino), para tratar de Contractor origen del  dolor. A veces se confirma por medio de una laparoscopa. La laparoscopa es un procedimiento en el que el profesional observa el interior del abdomen con un laparoscopio (un pequeo telescopio con forma de lpiz). El profesional podr tomar una pequea muestra de tejido (biopsia) de los tejidos  anormales para confirmar o Psychiatric nurse problema. Los tejidos se envan al laboratorio y un patlogo los observa bajo el microscopio para dar un diagnstico. TRATAMIENTO Una vez que se realiza el diagnstico, puede tratarse con la destruccin del tejido endometrial desplazado, utilizando calor (diatermia), lser, escisin (corte), o por medios qumicos. Tambin puede tratarse con terapia hormonal. Cuando se utiliza la terapia hormonal, se eliminan las [place], por lo tanto se elimina la exposicin mensual a la sangre del tejido endometrial desplazado. Slo en los casos graves es necesario realizar una histerectoma con extirpacin de las trompas, el tero y los ovarios. INSTRUCCIONES PARA EL CUIDADO DOMICILIARIO  Utilice los medicamentos de venta libre o de prescripcin para Chief Technology Officer, el malestar o la [place], segn se lo indique el profesional que lo asiste.  Evite las actividades que producen dolor, incluyendo las [employer] [place].  No tome aspirinas ya que puede aumentar las hemorragias cuando no recibe terapia hormonal.  Consulte al profesional que la asiste si siente dolor o tiene problemas que no puede controlar con Scientist, research (medical). SOLICITE ATENCIN MDICA DE INMEDIATO SI:  El dolor es intenso y no responde a Tourist information centre manager.  Comienza a sentir nuseas y vmitos o no puede [employer].  El dolor se localiza en la zona inferior derecha del abdomen (posible apendicitis).  Presenta hinchazn o aumento del dolor en el abdomen.  Tiene fiebre.  Observa sangre en la materia fecal. EST SEGURO QUE:   Comprende las instrucciones para el alta mdica.  Controlar su enfermedad.  Solicitar atencin mdica de inmediato segn las indicaciones. Document Released: 09/14/2005 Document Revised: 12/07/2011 [facility] Patient Information 2013 [place], [place].

## 2012-09-14 ENCOUNTER — Telehealth: Payer: Self-pay | Admitting: *Deleted

## 2012-09-14 DIAGNOSIS — N898 Other specified noninflammatory disorders of vagina: Secondary | ICD-10-CM

## 2012-09-14 DIAGNOSIS — B373 Candidiasis of vulva and vagina: Secondary | ICD-10-CM

## 2012-09-14 MED ORDER — TERCONAZOLE 0.4 % VA CREA: TOPICAL_CREAM | VAGINAL | Status: DC

## 2012-09-14 NOTE — Telephone Encounter (Signed)
Per Alisha Morris pt needs refill on Terazol cream Rx. rx called into pharmacy.

## 2013-08-08 ENCOUNTER — Encounter: Payer: Self-pay | Admitting: Gynecology

## 2013-08-08 ENCOUNTER — Ambulatory Visit (INDEPENDENT_AMBULATORY_CARE_PROVIDER_SITE_OTHER): Payer: BC Managed Care – PPO | Admitting: Gynecology

## 2013-08-08 VITALS — BP 118/78 | Ht 59.0 in | Wt 119.0 lb

## 2013-08-08 DIAGNOSIS — N898 Other specified noninflammatory disorders of vagina: Secondary | ICD-10-CM

## 2013-08-08 DIAGNOSIS — D259 Leiomyoma of uterus, unspecified: Secondary | ICD-10-CM

## 2013-08-08 DIAGNOSIS — L293 Anogenital pruritus, unspecified: Secondary | ICD-10-CM

## 2013-08-08 DIAGNOSIS — Z01419 Encounter for gynecological examination (general) (routine) without abnormal findings: Secondary | ICD-10-CM

## 2013-08-08 DIAGNOSIS — Z87410 Personal history of cervical dysplasia: Secondary | ICD-10-CM

## 2013-08-08 DIAGNOSIS — B373 Candidiasis of vulva and vagina: Secondary | ICD-10-CM

## 2013-08-08 LAB — CBC WITH DIFFERENTIAL/PLATELET
Basophils Absolute: 0 10*3/uL (ref 0.0–0.1)
Basophils Relative: 1 % (ref 0–1)
Lymphocytes Relative: 30 % (ref 12–46)
MCHC: 33.8 g/dL (ref 30.0–36.0)
Monocytes Absolute: 0.3 10*3/uL (ref 0.1–1.0)
Neutro Abs: 2.1 10*3/uL (ref 1.7–7.7)
Neutrophils Relative %: 60 % (ref 43–77)
Platelets: 246 10*3/uL (ref 150–400)
RDW: 15.2 % (ref 11.5–15.5)
WBC: 3.4 10*3/uL — ABNORMAL LOW (ref 4.0–10.5)

## 2013-08-08 LAB — COMPREHENSIVE METABOLIC PANEL
ALT: 17 U/L (ref 0–35)
AST: 21 U/L (ref 0–37)
Albumin: 4 g/dL (ref 3.5–5.2)
Alkaline Phosphatase: 45 U/L (ref 39–117)
BUN: 12 mg/dL (ref 6–23)
Potassium: 3.4 mEq/L — ABNORMAL LOW (ref 3.5–5.3)
Sodium: 141 mEq/L (ref 135–145)

## 2013-08-08 MED ORDER — TERCONAZOLE 0.4 % VA CREA: 1.0000 | TOPICAL_CREAM | Freq: Every day | VAGINAL | Status: DC

## 2013-08-08 NOTE — Progress Notes (Signed)
Alisha Morris 1981/08/28 [id number]   History:    32 y.o.  for annual gyn exam with no complaints today. Review of her record indicated that in 2009 she had CIN-1 with HPV changes. Subsequent Pap smears were normal.last Pap smear 2013. Patient currently not sexually active. Patient having normal menstrual cycles. Patient not interested in the flu vaccine.  Past medical history,surgical history, family history and social history were all reviewed and documented in the EPIC chart.  Gynecologic History Patient's last menstrual period was 08/03/2013. Contraception: none Last Pap: 2013. Results were: normal Last mammogram: not indicated. Results were: none indicated  Obstetric History OB History  Gravida Para Term Preterm AB SAB TAB Ectopic Multiple Living  1 1 1       1     # Outcome Date GA Lbr Len/2nd Weight Sex Delivery Anes PTL Lv  1 TRM     M SVD  N Y       ROS: A ROS was performed and pertinent positives and negatives are included in the history.  GENERAL: No fevers or chills. HEENT: No change in vision, no earache, sore throat or sinus congestion. NECK: No pain or stiffness. CARDIOVASCULAR: No chest pain or pressure. No palpitations. PULMONARY: No shortness of breath, cough or wheeze. GASTROINTESTINAL: No abdominal pain, nausea, vomiting or diarrhea, melena or bright red blood per rectum. GENITOURINARY: No urinary frequency, urgency, hesitancy or dysuria. MUSCULOSKELETAL: No joint or muscle pain, no back pain, no recent trauma. DERMATOLOGIC: No rash, no itching, no lesions. ENDOCRINE: No polyuria, polydipsia, no heat or cold intolerance. No recent change in weight. HEMATOLOGICAL: No anemia or easy bruising or bleeding. NEUROLOGIC: No headache, seizures, numbness, tingling or weakness. PSYCHIATRIC: No depression, no loss of interest in normal activity or change in sleep pattern.     Exam: chaperone present  BP 118/78  Ht 4\' 11"  (1.499 m)  Wt 119 lb (53.978 kg)  BMI 24.02 kg/m2  LMP  08/03/2013  Body mass index is 24.02 kg/(m^2).  General appearance : Well developed well nourished female. No acute distress HEENT: Neck supple, trachea midline, no carotid bruits, no thyroidmegaly Lungs: Clear to auscultation, no rhonchi or wheezes, or rib retractions  Heart: Regular rate and rhythm, no murmurs or gallops Breast:Examined in sitting and supine position were symmetrical in appearance, no palpable masses or tenderness,  no skin retraction, no nipple inversion, no nipple discharge, no skin discoloration, no axillary or supraclavicular lymphadenopathy Abdomen: no palpable masses or tenderness, no rebound or guarding Extremities: no edema or skin discoloration or tenderness  Pelvic:  Bartholin, Urethra, Skene Glands: Within normal limits             Vagina: No gross lesions or discharge  Cervix: No gross lesions or discharge  Uterus  anteverted, normal size, shape and consistency, non-tender and mobile  Adnexa  Without masses or tenderness  Anus and perineum  normal   Rectovaginal  normal sphincter tone without palpated masses or tenderness             Hemoccult none indicated     Assessment/Plan:  32 y.o. female for annual exam with no abnormalities detected. The new Pap smear screening guidelines were discussed. Pap smear not done today. Patient was instructed to do her monthly breast exam. The following labs were ordered today: CBC, screening cholesterol, comprehensive metabolic panel, TSH, and urinalysis.  Note: This dictation was prepared with  Dragon/digital dictation along withSmart phrase technology. Any transcriptional errors that result from this process are unintentional.  Ok Edwards MD, 3:12 PM 08/08/2013

## 2013-08-08 NOTE — Patient Instructions (Addendum)
Vacuna antigripal (vacuna antigripal inactivada) 2013 2014, Lo que debe saber  (Influenza Vaccine [Flu Vaccine, Inactivated] 2013 2014, What You Need to Know) PORQU VACUNARSE?   La influenza ("gripe") es una enfermedad contagiosa que se propaga por los [place] en invierno, por lo general entre octubre y [place].  La causa de la gripe es el virus de la influenza, y se puede contagiar por la tos, al estornudar y por el contacto cercano.  Cualquier persona puede Writer gripe, Biomedical engineer el riesgo es mayor entre los nios. Los sntomas aparecen rpidamente y pueden durar [address]. Pueden ser:  Grant Ruts o escalofros.  Dolor de Advertising copywriter.  Dolores musculares.  La fatiga.  Tos.  Dolor de [place].  Secrecin o congestin nasal. La gripe puede hacer que algunas personas se enfermen ms que otros. Entre [employer] se incluyen a los nios pequeos, las Smith International de 65 aos, las mujeres embarazadas y las personas con Runner, broadcasting/film/video, como enfermedades cardacas, pulmonares o renales, o que tienen un sistema inmunolgico debilitado. La vacuna contra la gripe es especialmente importante para estas personas y para todos los que estn en estrecho contacto con ellos.  La gripe tambin puede causar neumona y Theme park manager las afecciones existentes. En los nios, puede provocar diarrea y convulsiones.  Cada ao miles de [employer] [place] debido a la gripe y muchos ms deben ser hospitalizados.  La vacuna contra la gripe es la mejor proteccin que existe contra la gripe y sus complicaciones. La vacuna contra la gripe tambin ayuda a prevenir la propagacin de la gripe de Neomia Dear persona a Educational psychologist.  VACUNA INACTIVADA CONTRA LA GRIPE  Hay dos tipos de vacunas contra la gripe:   Usted recibir la vacuna de la gripe inactivada, que no contiene virus vivo. Se administra en forma de inyeccin con Marella Bile y se llama la "vacuna antigripal".  Otro tipo de vacuna con virus vivos,  atenuados (debilitados), se aplica en forma de aerosol en las fosas nasales. Esta vacuna se describe en el apartado Informacin sobre las vacunas. Se recomienda aplicarse la vacuna contra la gripe todos los [place]. Los nios [employer] 6 meses y los 8 aos de edad deben recibir 2 dosis Dispensing optician que se vacunen.  Los virus de la gripe [place] constantemente. Cada ao, la vacuna contra la gripe se actualiza para proteger contra los virus que tienen ms probabilidades de causar la enfermedad ese ao. Aunque la vacuna no puede prevenir todos los casos de gripe, es nuestra mejor defensa contra la enfermedad. Vacuna contra la gripe inactivada protege contra 3 o 4 virus diferentes.  Se tarda aproximadamente 2 semanas para desarrollar la proteccin despus de la vacunacin y la proteccin dura entre algunos meses y un ao.  Muchas veces se confunden con la gripe algunas enfermedades que no son causadas por el virus de la gripe. La vacuna contra la gripe no previene estas enfermedades. Slo se puede prevenir la gripe.  Para las personas de ms de 65 aos, se dispone de una vacuna contra la gripe de "dosis elevada". La persona que aplica la vacuna puede darle ms informacin al respecto.  Algunas de las vacunas contra la gripe inactivada contienen una cantidad muy pequea de un conservante a base de mercurio llamado timerosal. Algunos estudios han demostrado que el timerosal en las vacunas no es perjudicial, pero se dispone de vacunas contra la gripe que no contienen el conservante.  ALGUNAS PERSONAS NO DEBEN RECIBIR ESTA VACUNA Informe a la  persona que le aplica la vacuna:   Si sufre alguna alergia grave (que pone en peligro la vida). Si alguna vez tuvo una reaccin alrgica potencialmente mortal despus de Neomia Dear dosis de la vacuna contra la gripe, o tuvo una alergia grave a cualquiera de los componentes de [place], es posible que se le recomiende no recibir una dosis. La [employer]son de las vacunas contra la gripe,  aunque no todas, contienen una pequea cantidad de [place].  Si alguna vez ha sufrido el sndrome de Pension scheme manager (una enfermedad paralizante grave tambin llamada GBS). Algunas personas con antecedentes de GBS no deben recibir esta vacuna. Debe comentarlo con su mdico.  Si no se siente bien. Podran sugerirle que espere hasta sentirse mejor. Pero debe volver. RIESGOS DE UNA REACCIN A LA VACUNA Con la vacuna, como cualquier medicamento, existe la posibilidad de sufrir efectos secundarios. Suelen ser leves y desaparecen por s solos.  Los efectos secundarios graves son [place], pero son Lynnae Sandhoff raros. Vacuna de la gripe inactivada no contiene el virus vivo de la gripe, la gripe por lo tanto enfermarse por recibir la vacuna no es posible.  Episodios de desmayo leves y sntomas relacionados (tales como sacudidas) pueden presentarse despus de cualquier procedimiento mdico, incluyendo la vacunacin. Si permanece sentado o recostado durante 15 minutos despus de la vacunacin puede ayudar a [employer] Corporation y las lesiones causadas por las cadas. Informe al mdico si se siente mareado o aturdido, tiene [employer] Industries visin o zumbidos en los odos.  Problemas leves luego de recibir la vacuna de la gripe inactivada:   Barista, enrojecimiento o Paramedic en el que le aplicaron la vacuna.  Ronquera; dolor, inflamacin o picazn en los ojos o tos.  Grant Ruts.  Dolores.  Dolor de [place].  Picazn.  Fatiga. Si estos problemas ocurren, en general comienzan poco despus de vacunarse y duran 1  2 das.  Problemas moderados luego de recibir la vacuna de la gripe inactivada:   Los nios que reciben la vacuna contra la gripe inactivada y Research scientist (medical) antineumoccica (PCV13) al mismo tiempo, pueden tener un mayor riesgo de sufrir convulsiones causadas por fiebre. Consulte a su mdico para obtener ms informacin. Informe a su mdico si un nio que est recibiendo la vacuna contra  la gripe ha tenido una convulsin. Problemas graves luego de recibir la vacuna inactivada contra la gripe:   Neomia Dear reaccin alrgica grave puede ocurrir despus de la administracin de cualquier vacuna (se estima en menos de 1 en un milln de dosis).  Hay una pequea posibilidad de que la vacuna de la gripe inactivada est asociada con el sndrome de Guillain-Barr (GBS), no ms de 1 o 2 casos por milln de personas vacunadas. Es Chief Operating Officer que el riesgo de sufrir complicaciones graves por la gripe, que puede prevenirse con la vacunacin. Se controla permanentemente la seguridad de las vacunas. Para obtener ms informacin, consulte [email or web address]/  QU PASA SI HAY UNA REACCIN GRAVE?  Qu signos debo buscar?   Observe todo lo que le preocupe, como signos de una reaccin alrgica grave, fiebre muy alta o cambios en el comportamiento. Los signos de Runner, broadcasting/film/video grave pueden incluir urticaria, hinchazn de la cara y la garganta, dificultad para respirar, ritmo cardaco acelerado, mareos y debilidad. Pueden comenzar entre unos pocos minutos y algunas horas despus de la vacunacin.  Qu debo hacer?   Si usted piensa que se trata de una reaccin alrgica grave o de otra emergencia  que no puede esperar, llame al 911 o lleve a la persona al hospital ms cercano. De lo contrario, llame a su mdico.  Despus, la reaccin debe informarse a la "Vaccine Adverse Event Reporting System" (Sistema de informacin sobre efectos adversos de las vacunas -VAERS). Su mdico puede presentar este informe, o puede hacerlo usted mismo a travs del sitio web de VAERS, en www.vaers.LAgents.no, o llamando al 501-389-5376. VAERS es slo para informar reacciones. No brindan consejo mdico.  PROGRAMA NACIONAL DE COMPENSACIN DE DAOS POR VACUNAS  El National Vaccine Injury Compensation Program (VICP) es un programa federal que fue creado para compensar a las personas que puedan haber sufrido daos al  recibir ciertas vacunas.  Aquellas personas que consideren que han sufrido un dao como consecuencia de una vacuna y quieren saber ms acerca del programa y como presentar Roslynn Amble, [place] llamar al [phone] o visitar su sitio web en [email or web address].  CMO PUEDO OBTENER MS INFORMACIN?   Consulte a su mdico.  Comunquese con el servicio de salud de su localidad o [address].  Comunquese con los Centros para el control y la prevencin de Child psychotherapist for Disease Control and Prevention , CDC).  Llame al ([phone] [phone] (1-800-CDC-INFO) o  Visite la pgina web de los CDC en BiotechRoom.com.cy. CDC Inactivated Influenza Vaccine Interim VIS (04/22/12)  Document Released: 12/11/2008 Document Revised: 06/08/2012 ExitCare Patient Information 2014 [place], [place].   Autoexamen de [employer]  Chief Executive Officer) El autoexamen de mamas puede detectar problemas de manera temprana, prevenir complicaciones mdicas significativas y posiblemente salvar su vida. Al hacerlo, podr familiarizarse con el aspecto y forma de sus [place], y observar cambios. Esto le permite descubrir cambios de manera precoz. Este autoexamen Murphy Oil ofrece la tranquilidad de que sus senos estn en buen [place] de [place]. Una forma de aprender qu es normal para sus mamas y si sufren modificaciones es Radio producer un autoexamen.   Si encuentra un bulto o algo que no estaba presente anteriormente, lo mejor es ponerse en contacto con su mdico inmediatamente. Otro hallazgo que debe ser evaluado por su mdico es la secrecin del pezn, especialmente si es con sangre; cambios en la piel o enrojecimiento; reas donde la piel parece estar tironeada (retrada) o nuevos bultos o protuberancias. El dolor en los senos es rara vez se asocia con el cncer (malignidad), pero tambin debe ser evaluado por un mdico.  CMO REALIZAR EL AUTOEXAMEN DE MAMAS  El mejor momento para examinar sus mamas es a los 5 a 7 das despus  de finalizado el perodo menstrual. Durante la menstruacin, las mamas estn ms abultadas y puede haber ms dificultad para Clinical research associate modificaciones. Si no menstra, ha llegado a la menopausia, o le han extirpado el tero (histerectomia), usted debe examinar sus senos a intervalos regulares, por ejemplo cada mes. Si est amamantando, examine sus senos despus de alimentar al beb o despus de usar un extractor de [place]. Los implantes mamarios no disminuyen el riesgo de bultos o tumores, por lo que debe seguir realizando el autoexamen de [employer] se recomienda. Hable con su mdico acerca de cmo determinar la diferencia entre el implante y el tejido [place]. Adems, debe consultar cuanta presin debe hacer durante el examen. Con el tiempo se familiarizar con las variaciones de las mamas y se sentir ms cmoda para Horticulturist, commercial. Para el autoexamen deber quitarse toda la ropa de la cintura para [place].  1.  Observe sus senos y pezones. Prese frente a un espejo en una habitacin con buena  iluminacin. Con las Rockwell [employer]ation caderas, presione las manos firmemente [place]. Busque diferencias en la forma, el contorno y el tamao de un pecho al otro (asimetras). Entre las asimetras se incluyen arrugas, depresiones o protuberancias. Tambin, busque cambios en la piel, como reas enrojecidas o escamosas. Busque cambios en los pezones, como secreciones, hoyuelos, cambios en la posicin, o enrojecimiento. 2. Palpe cuidadosamente sus senos. Es mucho mejor Darden Restaurants en la ducha o en la baera, [place] jabn o cuando est recostada sobre su espalda. Coloque el brazo (en el lado de la mama que se examina) por arriba de la cabeza. Use las yemas (no las puntas) de los tres dedos centrales de la mano opuesta para palpar. Comience en la zona de la axila, haga crculos de  de pulgada (2 cm) y vaya superponindolos. Utilice 3 niveles diferentes de presin (ligero, medio y [place]) en cada crculo antes de pasar al  siguiente. Se necesita una presin ligera para sentir los tejidos ms cercanos a la piel. La presin media ayudar a sentir el tejido [employer]apeake [employer] un poco ms profundo, mientras que se necesita una presin firme para palpar el tejido que se encuentra cerca de las [place]. Continuar superponiendo crculos y vaya hacia abajo, hasta sentir las [place], por debajo del [place]. Luego mueva un espacio del ancho de un dedo hacia el centro del cuerpo. Siga con los crculos del  de pulgada (2 cm) mientras va lentamente hacia la clavcula, cerca de la base del cuello. Contine con el examen hacia arriba y hacia abajo con las 3 intensidades de presin Civil Service fast streamer a la mitad del pecho. Hgalo con cada seno cuidadosamente, buscando bultos o modificaciones. 3. Debe llevar un registro escrito con los cambios o los hallazgos normales que encuentre para cada seno. Si registra esta informacin, no tiene que depender slo de la memoria para Designer, industrial/product, la sensibilidad o la ubicacin de los [place]. Anote en qu momento se encuentra del ciclo menstrual, si usted todava est menstruando. El tejido [employer] puede tener algunos bultos o tejidos engrosados. Sin embargo, consulte a su mdico si usted Animal nutritionist.   SOLICITE ATENCIN MDICA SI:   Observa cambios en la forma, en el contorno o el tamao de las mamas o los pezones.   Hay modificaciones en la piel, como zonas enrojecidas o escamosas en las mamas o en los pezones.   Tiene una secrecin anormal en los pezones.   Siente un nuevo bulto o reas engrosadas de [place] anormal.  Document Released: 09/14/2005 Document Revised: 08/31/2012 [facility] Patient Information 2014 [place], [place].

## 2013-08-09 ENCOUNTER — Telehealth: Payer: Self-pay | Admitting: *Deleted

## 2013-08-09 LAB — URINALYSIS W MICROSCOPIC + REFLEX CULTURE
Bilirubin Urine: NEGATIVE
Crystals: NONE SEEN
Glucose, UA: NEGATIVE mg/dL
Ketones, ur: NEGATIVE mg/dL
Protein, ur: NEGATIVE mg/dL

## 2013-08-09 NOTE — Telephone Encounter (Signed)
Message copied by Aura Camps on Wed Aug 09, 2013  3:38 PM ------      Message from: Keenan Bachelor      Created: Wed Aug 09, 2013 11:36 AM      Regarding: referral       Per Dr. Glenetta Hew:  Please make an appointment for this patient with hematologist / oncologist @ [facility] for consultation on this patient with persistant  leukopenia . Tell patient we are referring her because her WBC continues to be low and needs further evaluation. [place] or [place] may have to give her details because of the language. ------

## 2013-08-09 NOTE — Telephone Encounter (Signed)
Referral faxed to cancer center, they will contact pt with time and date.

## 2013-08-14 ENCOUNTER — Telehealth: Payer: Self-pay | Admitting: Oncology

## 2013-08-14 NOTE — Telephone Encounter (Signed)
C/D 08/14/13 for appt. 08/30/13 °

## 2013-08-14 NOTE — Telephone Encounter (Signed)
[facility] FOR PT TO RETURN CALL IN RE TO REFERRAL.  °

## 2013-08-14 NOTE — Telephone Encounter (Signed)
S/w pt and gve np appt 12/03 @ 10:30 w/Dr/ Darrold Span Referring Dr. Lily Peer Dx- Leukopenia Welcome packet mailed.

## 2013-08-15 NOTE — Telephone Encounter (Signed)
appt 08/30/13 @ 10:30 am

## 2013-08-26 ENCOUNTER — Other Ambulatory Visit: Payer: Self-pay | Admitting: Oncology

## 2013-08-26 DIAGNOSIS — D72819 Decreased white blood cell count, unspecified: Secondary | ICD-10-CM | POA: Insufficient documentation

## 2013-08-26 DIAGNOSIS — D649 Anemia, unspecified: Secondary | ICD-10-CM

## 2013-08-30 ENCOUNTER — Ambulatory Visit: Payer: BC Managed Care – PPO | Admitting: Oncology

## 2013-08-30 ENCOUNTER — Other Ambulatory Visit: Payer: BC Managed Care – PPO | Admitting: Lab

## 2013-08-30 ENCOUNTER — Ambulatory Visit: Payer: BC Managed Care – PPO

## 2013-09-04 ENCOUNTER — Ambulatory Visit: Payer: BC Managed Care – PPO | Admitting: Oncology

## 2013-09-04 ENCOUNTER — Other Ambulatory Visit: Payer: BC Managed Care – PPO | Admitting: Lab

## 2013-09-04 ENCOUNTER — Ambulatory Visit: Payer: BC Managed Care – PPO

## 2013-09-29 ENCOUNTER — Other Ambulatory Visit: Payer: Self-pay | Admitting: Oncology

## 2013-09-29 ENCOUNTER — Encounter: Payer: Self-pay | Admitting: Oncology

## 2013-09-29 ENCOUNTER — Other Ambulatory Visit (HOSPITAL_BASED_OUTPATIENT_CLINIC_OR_DEPARTMENT_OTHER): Payer: BC Managed Care – PPO

## 2013-09-29 ENCOUNTER — Ambulatory Visit (HOSPITAL_BASED_OUTPATIENT_CLINIC_OR_DEPARTMENT_OTHER): Payer: BC Managed Care – PPO | Admitting: Oncology

## 2013-09-29 ENCOUNTER — Ambulatory Visit: Payer: BC Managed Care – PPO

## 2013-09-29 VITALS — BP 124/75 | HR 65 | Temp 98.2°F | Resp 18 | Ht 59.0 in | Wt 122.1 lb

## 2013-09-29 DIAGNOSIS — D649 Anemia, unspecified: Secondary | ICD-10-CM

## 2013-09-29 DIAGNOSIS — D72819 Decreased white blood cell count, unspecified: Secondary | ICD-10-CM

## 2013-09-29 LAB — CBC WITH DIFFERENTIAL/PLATELET
BASO%: 1.2 % (ref 0.0–2.0)
Basophils Absolute: 0.1 10*3/uL (ref 0.0–0.1)
EOS%: 1.1 % (ref 0.0–7.0)
Eosinophils Absolute: 0 10*3/uL (ref 0.0–0.5)
HEMATOCRIT: 35.8 % (ref 34.8–46.6)
HGB: 11.9 g/dL (ref 11.6–15.9)
LYMPH#: 1.2 10*3/uL (ref 0.9–3.3)
LYMPH%: 26 % (ref 14.0–49.7)
MCH: 26.5 pg (ref 25.1–34.0)
MCHC: 33.3 g/dL (ref 31.5–36.0)
MCV: 79.7 fL (ref 79.5–101.0)
MONO#: 0.3 10*3/uL (ref 0.1–0.9)
MONO%: 6.7 % (ref 0.0–14.0)
NEUT%: 65 % (ref 38.4–76.8)
NEUTROS ABS: 2.9 10*3/uL (ref 1.5–6.5)
Platelets: 223 10*3/uL (ref 145–400)
RBC: 4.49 10*6/uL (ref 3.70–5.45)
RDW: 14.8 % — AB (ref 11.2–14.5)
WBC: 4.5 10*3/uL (ref 3.9–10.3)

## 2013-09-29 LAB — MORPHOLOGY: PLT EST: ADEQUATE

## 2013-09-29 LAB — FERRITIN CHCC: FERRITIN: 12 ng/mL (ref 9–269)

## 2013-09-29 LAB — IRON AND TIBC CHCC
%SAT: 20 % — ABNORMAL LOW (ref 21–57)
IRON: 71 ug/dL (ref 41–142)
TIBC: 358 ug/dL (ref 236–444)
UIBC: 287 ug/dL (ref 120–384)

## 2013-09-29 LAB — CHCC SMEAR

## 2013-09-29 NOTE — Progress Notes (Signed)
[facility] NEW PATIENT EVALUATION   Name: Alisha Morris Date: 09/29/2013 MRN: [id number] DOB: 11-15-1980  REFERRING PHYSICIAN: Terrance Mass, MD PCP is clinic on [address]: leukopenia   HISTORY OF PRESENT ILLNESS:Alisha Morris is a 33 y.o. Hispanic  female who is seen in consultation, together with interpreter, at the request of Dr Uvaldo Rising due to reported leukopenia. Patient is not aware of any previous hematologic problems. Outside records from Dr Toney Rakes reviewed:  CBC 04-2012 had WBC 4.1, Hgb 11.9, MCV 77; CBC from 08-20-2012 had WBC 3.3, ANC 2.0, Hgb 10.6, MCV 81 and platelets 265k; CBC from 08-08-2013 had WBC 3.4, ANC 2.1, Hgb 12, MCV 77.7 and plt 246k. She was also HIV negative and Hep B,C negative in 2013, UA negative in 07-2013 ad TSH 0.727 then.   REVIEW OF SYSTEMS:  No infectious illness in past several months. Good energy, no fevers or sweats, no pain. No respiratory, GI, urinary, musculoskeletal complaints. No HA or other neurologic symptoms. Good visual acuity without corrective lenses. No known active dental problems, no environmental allergies or sinus symptoms. She is at usual weight with good appetite. No arthritis. No skin rash. No unusual bleeding or bruising. No history blood clots. No new medications Remainder of full 10 point review of systems negative.   ALLERGIES: Review of patient's allergies indicates no known allergies.  PAST MEDICAL/ SURGICAL HISTORY:    CIN-1 with HPV changes 2009, subsequent normal PAPs Cervical conization ~ 2009 Diagnostic laparoscopy 01-2011 right pelvic wall adhesion, possible endometriosis Not clear if she had colonoscopy 2009 (? D.Jacobs) No other surgery  CURRENT MEDICATIONS: reviewed as listed now in EMR    SOCIAL HISTORY:  In [place] x 15 years. Works in Cytogeneticist. Single, one child. No cigarettes ever, no ETOH, no transfusions   FAMILY HISTORY:  No information about  father Mother healthy 1 brother healthy 1 child healthy          PHYSICAL EXAM:  height is 4\' 11"  (1.499 m) and weight is 122 lb 2 oz (55.396 kg). Her oral temperature is 98.2 F (36.8 C). Her blood pressure is 124/75 and her pulse is 65. Her respiration is 18.  Alert, pleasant, cooperative, healthy appearing lady looks stated age. Easily mobile.  HEENT:PERRL, not icteric. Oral mucosa moist and clear. Mucous membranes not pale. Neck supple without JVD or thyroid mass.  RESPIRATORY: respirations not labored RA. Clear to A and P  CARDIAC/ VASCULAR: RRR no murmur or gallop  ABDOMEN: soft, nontender, no HSM or mass, normal bowel sounds  LYMPH NODES: no cervical supraclavicular axillary inguinal nodes  NEUROLOGIC: speech fluent, fully oriented and appropriate. Motor, sensory, cerebellar nonfocal  SKIN: no rash or ecchymosis  MUSCULOSKELETAL: symmetrical good muscle mass. Back not tender. LE no edema, cords, tenderness    LABORATORY DATA:  Results for orders placed in visit on 09/29/13 (from the past 48 hour(s))  CBC WITH DIFFERENTIAL     Status: Abnormal   Collection Time    09/29/13 10:21 AM      Result Value Range   WBC 4.5  3.9 - 10.3 10e3/uL   NEUT# 2.9  1.5 - 6.5 10e3/uL   HGB 11.9  11.6 - 15.9 g/dL   HCT 35.8  34.8 - 46.6 %   Platelets 223  145 - 400 10e3/uL   MCV 79.7  79.5 - 101.0 fL   MCH 26.5  25.1 - 34.0 pg   MCHC 33.3  31.5 -  36.0 g/dL   RBC 4.49  3.70 - 5.45 10e6/uL   RDW 14.8 (*) 11.2 - 14.5 %   lymph# 1.2  0.9 - 3.3 10e3/uL   MONO# 0.3  0.1 - 0.9 10e3/uL   Eosinophils Absolute 0.0  0.0 - 0.5 10e3/uL   Basophils Absolute 0.1  0.0 - 0.1 10e3/uL   NEUT% 65.0  38.4 - 76.8 %   LYMPH% 26.0  14.0 - 49.7 %   MONO% 6.7  0.0 - 14.0 %   EOS% 1.1  0.0 - 7.0 %   BASO% 1.2  0.0 - 2.0 %  MORPHOLOGY     Status: None   Collection Time    09/29/13 10:21 AM      Result Value Range   Polychromasia Slight  Slight   Ovalocytes Moderate  Negative   White Cell  Comments C/W auto diff     PLT EST Adequate  Adequate  [facility] SMEAR     Status: None   Collection Time    09/29/13 10:21 AM      Result Value Range   Smear Result Smear Available     available after visit: serum iron 71, %sat 20 , ferritin 12 ANA pending  PATHOLOGY: none   RADIOGRAPHY: No results found.     DISCUSSION: I have reassured patient that white count is completely normal now and that I find nothing on history or PE of concern in this regard or with other blood counts now. I have discussed normal bell curve distribution of blood counts thru the population, as she may well normally run on lower end of the normal curve. Questions answered fully.    IMPRESSION / PLAN:  1.normal blood counts by CBC today, in 33 year old lady otherwise also healthy. She will follow up with gyn and PCP as instructed, and at this office prn. 2.hx CIN post cervical conization 3.pelvic adhesions at previous laparoscopy    Patient and accompanying individuals have had questions answered to their satisfaction and are in agreement with plan above. They can contact this office for questions or concerns at any time prior to next scheduled visit.  Time spent 20 min, including >50% discussion and coordination of care.    Brogan England P, MD 09/29/2013 1:27 PM

## 2013-10-02 LAB — ANA: Anti Nuclear Antibody(ANA): NEGATIVE

## 2014-06-05 ENCOUNTER — Ambulatory Visit: Payer: BC Managed Care – PPO | Admitting: Gynecology

## 2014-06-13 ENCOUNTER — Encounter: Payer: Self-pay | Admitting: Gynecology

## 2014-06-13 ENCOUNTER — Ambulatory Visit (INDEPENDENT_AMBULATORY_CARE_PROVIDER_SITE_OTHER): Payer: BC Managed Care – PPO | Admitting: Gynecology

## 2014-06-13 VITALS — BP 122/80

## 2014-06-13 DIAGNOSIS — N923 Ovulation bleeding: Secondary | ICD-10-CM

## 2014-06-13 DIAGNOSIS — N898 Other specified noninflammatory disorders of vagina: Secondary | ICD-10-CM

## 2014-06-13 DIAGNOSIS — A499 Bacterial infection, unspecified: Secondary | ICD-10-CM

## 2014-06-13 DIAGNOSIS — N760 Acute vaginitis: Secondary | ICD-10-CM

## 2014-06-13 DIAGNOSIS — B9689 Other specified bacterial agents as the cause of diseases classified elsewhere: Secondary | ICD-10-CM

## 2014-06-13 DIAGNOSIS — N9489 Other specified conditions associated with female genital organs and menstrual cycle: Secondary | ICD-10-CM

## 2014-06-13 DIAGNOSIS — N921 Excessive and frequent menstruation with irregular cycle: Secondary | ICD-10-CM

## 2014-06-13 DIAGNOSIS — Z3009 Encounter for other general counseling and advice on contraception: Secondary | ICD-10-CM

## 2014-06-13 LAB — PREGNANCY, URINE: Preg Test, Ur: NEGATIVE

## 2014-06-13 LAB — WET PREP FOR TRICH, YEAST, CLUE
Trich, Wet Prep: NONE SEEN
WBC WET PREP: NONE SEEN
YEAST WET PREP: NONE SEEN

## 2014-06-13 MED ORDER — TINIDAZOLE 500 MG PO TABS: ORAL_TABLET | ORAL | Status: DC

## 2014-06-13 MED ORDER — NORETHIN ACE-ETH ESTRAD-FE 1-20 MG-MCG PO TABS: 1.0000 | ORAL_TABLET | Freq: Every day | ORAL | Status: DC

## 2014-06-13 NOTE — Addendum Note (Signed)
Addended by: Thurnell Garbe A on: 06/13/2014 04:14 PM   Modules accepted: Orders

## 2014-06-13 NOTE — Progress Notes (Signed)
   Patient is a 33 year old who presented to the office stating that for the past several months that she has had spotting for 8 days before her cycles and her periods last for 3 days and then she would continue spotting for 8 additional days. She denies any dyspareunia. She's not using any form of contraception. Some occasional breast tenderness. No nausea or vomiting. Patient also wanted to discuss guarding or contraceptive pills for contraception.  Exam: Abdomen: Soft nontender no rebound or guarding Pelvic: Bartholin urethra Skene within normal limits Vagina clear discharge with a fishy odor was detected Cervix: No gross lesions on inspection Uterus: Anteverted normal size shape and consistency Adnexa: Tenderness in the right lower quadrant Rectal exam: Not done  Where the process of proceeding with a sonohysterogram but due to the vaginal odor detected a wet prep was performed. A wet prep demonstrated evidence of bacterial vaginosis (positive amine, moderate clue cells, 2 numerous to count bacteria  Urine pregnancy test: Negative  Assessment/plan: #1 bacterial vaginosis will be treated with Tindamax 500 mg she will take 4 tablets today and then repeat in 24 hours.  #2 on the second day of her upcoming menstrual cycle she will be started on 28 day oral contraceptive pill Junel 1/20 to regulate her cycles  #3 she will return back to the office for a sonohysterogram to completely evaluate her intrauterine cavity to rule out the possibility of endometrial polyp or submucous myoma and also because of the tenderness in the right lower quadrant to rule out ovarian cyst.

## 2014-06-13 NOTE — Patient Instructions (Signed)
Hemorroides  (Hemorrhoids) Las hemorroides son venas inflamadas alrededor del recto o del ano. Hay dos tipos de hemorroides:   Hemorroides internas. Se forman en las venas del interior del recto. Pueden abultarse hacia el exterior e irritarse y Secretary/administrator.  Hemorroides externas. Se producen en las venas externas al ano y pueden sentirse como un bulto o zona hinchada dura y dolorosa cerca del ano. CAUSAS   Embarazo.   Obesidad.   Constipacin o diarrea.   Dificultad para mover el intestino.   Permanecer sentado durante largos perodos en el inodoro.  Levantar objetos pesados u otras actividades que impliquen esfuerzo.  Sexo anal. SNTOMAS   Dolor.   Picazn o irritacin anal.   Sangrado rectal.   Prdida fecal.   Inflamacin anal.   Uno o ms bultos en la zona anal.  DIAGNSTICO  El mdico puede diagnosticar las hemorroides mediante un examen visual. Otros estudios o anlisis que se pueden realizar son:   Examen de la zona rectal con Ardelia Mems mano enguantada (examen digital rectal).   Examen del canal anal utilizando un pequeo tubo (endoscopio).   Anlisis de sangre si ha perdido [place] cantidad significativa de [place].  Un estudio para observar el interior del colon (sigmoideoscopa o colonoscopa). TRATAMIENTO  La mayora de las hemorroides pueden tratarse en casa. Sin embargo, si los sntomas no mejoran o tiene Nurse, children's, el mdico puede Optometrist un procedimiento para disminuir las hemorroides o extirparlas completamente. Los tratamientos posibles son:   Colocacin de una banda de goma en la base de la hemorroide para cortar la circulacin (ligadura con Forensic psychologist).   Inyeccin de una sustancia qumica para Neurosurgeon las hemorroides (escleroterapia).   Utilizacin de un instrumento para quemar las hemorroides (terapia con luz infrarroja).   Extirpacin quirrgica de la hemorroides (hemorroidectoma).   Colocacin de grapas en la hemorroides para  bloquear el flujo de sangre a los tejidos (engrapado de hemorroides).  INSTRUCCIONES PARA EL CUIDADO EN EL HOGAR   Consuma alimentos con fibra, como cereales integrales, legumbres, frutos secos, frutas y verduras. Pregntele a su mdico acerca de tomar productos con fibra aadida en ellos (suplementos defibra).  Aumente la ingestin de lquidos. Beba gran cantidad de lquido para mantener la orina de tono claro o color amarillo plido.   Haga ejercicios regularmente.   Vaya al bao cuando sienta la necesidad de mover el intestino. No espere.   Evite hacer fuerza al mover el intestino.   Mantenga la zona anal limpia y seca. Use papel higinico hmedo o toallitas humedecidas despus de mover el intestino.   Puede usar o Midwife segn las indicaciones algunas cremas especiales y supositorios.   Tome slo medicamentos de venta libre o recetados, segn las indicaciones del mdico.   Tome baos de asiento tibios durante 15-20 minutos, 3-4 veces por da para Glass blower/designer y las [place].   Coloque una bolsa de hielo sobre las hemorroides si le duelen o se hinchan. Usar compresas de [employer] baos de asiento puede ser [place].   Ponga el hielo en una bolsa plstica.   Colquese una toalla entre la piel y la bolsa de hielo.   Deje el hielo durante 15 - 20 minutos y aplquelo 3 - 4 veces por Training and development officer.   No utilice una almohada en forma de aro ni se siente en el inodoro durante perodos prolongados. Esto aumenta la afluencia de sangre y Conservation officer, historic buildings.  SOLICITE ATENCIN MDICA SI:   Aumenta el dolor y la hinchazn y no  puede controlarlo con la medicacin o con un tratamiento.  Tiene un sangrado que no Magazine features editor.  No puede mover el intestino.  Siente dolor o tiene inflamacin fuera de la zona de las hemorroides. ASEGRESE DE QUE:   Comprende estas instrucciones.  Controlar su enfermedad.  Recibir ayuda de inmediato si no mejora o si empeora. Document  Released: 09/14/2005 Document Revised: 05/17/2013 [facility] Patient Information 2015 [place], [place]. This information is not intended to replace advice given to you by your health care provider. Make sure you discuss any questions you have with your health care provider. Tinidazole tablets Qu es este medicamento? El TINIDAZOL es un medicamento antiinfeccioso. Se utiliza para tratar la amebiasis, giardiasis, tricomonosis y vaginosis. No es efectivo para resfros, gripe u otras infecciones de origen viral. Este medicamento puede ser utilizado para otros usos; si tiene alguna pregunta consulte con su proveedor de atencin mdica o con su farmacutico. MARCAS COMERCIALES DISPONIBLES: [place] Qu le debo informar a mi profesional de la salud antes de tomar este medicamento? Necesita saber si usted presenta alguno de los siguientes problemas o situaciones: -anemia u otros trastornos sanguneos -si consume bebidas alcohlicas con frecuencia -recibe hemodilisis -trastorno de convulsiones -una reaccin alrgica o inusual al tinidazol, a otros medicamentos, alimentos, colorantes o conservantes -si est embarazada o buscando quedar embarazada -si est amamantando a un beb Cmo debo utilizar este medicamento? Tome este medicamento por va oral con un vaso lleno de agua. Siga las instrucciones de la etiqueta del [place]. Tomar con alimentos. Tome sus dosis a intervalos regulares. No tome su medicamento con una frecuencia mayor a la indicada. Complete todas las dosis de su medicamento como se le haya indicado aun si se siente mejor. No omita ninguna dosis o suspenda el uso de su medicamento antes de lo indicado. Hable con su pediatra para informarse acerca del uso de este medicamento en nios. Aunque este medicamento ha sido recetado a nios tan menores como de 3 aos de edad para condiciones selectivas, las precauciones se aplican. Sobredosis: Pngase en contacto inmediatamente con un centro  toxicolgico o una sala de urgencia si usted cree que haya tomado demasiado medicamento. ATENCIN: [employer] es solo para usted. No comparta este medicamento con nadie. Qu sucede si me olvido de una dosis? Si olvida una dosis, tmela lo antes posible. Si es casi la hora de la prxima dosis, tome slo esa dosis. No tome dosis adicionales o dobles. Qu puede interactuar con este medicamento? No tome esta medicina con ninguno de los siguientes medicamentos: -alcohol o cualquier producto que contenga alcohol -solucin oral de amprenavir -disulfiram -inyeccin de paclitaxel -solucin oral de ritonavir -solucin oral de sertralina -inyeccin de sulfametoxasol-trimetoprima Esta medicina tambin puede interactuar con los siguientes medicamentos: -colestiramina -cimetidina -conivaptn -ciclosporina -fluorouracilo -fosfenitona, fenitona -quetoconazol -litio -fenobarbital -tacrolimo -warfarina Puede ser que esta lista no menciona todas las posibles interacciones. Informe a su profesional de [employer] de [employer] productos a base de hierbas, medicamentos de [place] o suplementos nutritivos que est tomando. Si usted fuma, consume bebidas alcohlicas o si utiliza drogas ilegales, indqueselo tambin a su profesional de [employer]. Algunas sustancias pueden interactuar con su medicamento. A qu debo estar atento al usar [employer]? Si los sntomas no mejoran o si empeoran, consulte con su mdico o con su profesional de [employer]. Evite consumir las bebidas alcohlicas mientras toma este medicamento y [address] despus. El alcohol puede hacerle sentir mareado, enfermo o enrojecimiento. Si est recibiendo tratamiento para [place] enfermedad  de transmisin sexual, evite todo contacto sexual hasta que haya terminado el [place]. Es posible que su pareja tambin necesite [place]. Qu efectos secundarios puedo tener al [employer] este medicamento? Efectos secundarios  que debe informar a su mdico o a Barrister's clerk de la salud tan pronto como sea posible: -Chief of Staff como erupcin cutnea, picazn o urticarias, hinchazn de la cara, labios o lengua -problemas respiratorios -confusin, depresin -manchas oscuras o blancas en la boca -sensacin de desmayos o mareos, cadas -fiebre, infeccin -entumecimiento, hormigueo, dolor o debilidad en las manos o pies -dolor al orinar -convulsiones -cansancio o debilidad inusual -irritacin o flujo vaginal -vmito Efectos secundarios que, por lo general, no requieren atencin mdica (debe informarlos a su mdico o a su profesional de la salud si persisten o si son molestos): -orina de color marrn oscuro o rojo -diarrea -dolor de cabeza -prdida del apetito -sabor metlico -nuseas -Higher education careers adviser Puede ser que esta lista no menciona todos los posibles efectos secundarios. Comunquese a su mdico por asesoramiento mdico [employer]. Usted puede informar los efectos secundarios a la FDA por telfono al 1-800-FDA-1088. Dnde debo guardar mi medicina? Mantngala fuera del alcance de los nios. Gurdela a [employer], entre 15 y 33 grados C (52 y 52 grados F). Protjala de la luz y de la humedad. Mantenga el envase bien cerrado. Deseche todo el medicamento que no haya utilizado, despus de la fecha de vencimiento. ATENCIN: Este folleto es un resumen. Puede ser que no cubra toda la posible informacin. Si usted tiene preguntas acerca de esta medicina, consulte con su mdico, su farmacutico o su profesional de Technical sales engineer.  2015, Elsevier/Gold Standard. (2007-04-24 16:39:00) Vaginosis bacteriana (Bacterial Vaginosis) La vaginosis bacteriana es una infeccin vaginal que perturba el equilibrio normal de las bacterias que se encuentran en la vagina. Es el resultado de un crecimiento excesivo de ciertas bacterias. Esta es la infeccin vaginal ms frecuente en mujeres en edad  reproductiva. El tratamiento es importante para prevenir complicaciones, especialmente en mujeres embarazadas, dado que puede causar un parto prematuro. CAUSAS  La vaginosis bacteriana se origina por un aumento de bacterias nocivas que, generalmente, estn presentes en cantidades ms pequeas en la vagina. Varios tipos diferentes de bacterias pueden causar esta afeccin. Sin embargo, la causa de su desarrollo no se comprende totalmente. [place] o comportamientos pueden exponerlo a un mayor riesgo de desarrollar vaginosis bacteriana, entre los que se incluyen:  Tener una nueva pareja sexual o mltiples parejas sexuales.  Las duchas vaginales  El uso del DIU (dispositivo intrauterino) como mtodo anticonceptivo. El contagio no se produce en baos, por ropas de cama, en piscinas o por contacto con objetos. SIGNOS Y SNTOMAS  Algunas mujeres que padecen vaginosis bacteriana no presentan signos ni sntomas. Los sntomas ms comunes son:  Secrecin vaginal de color grisceo.  Secrecin vaginal con olor similar al [employer], especialmente despus de Retail banker.  Picazn o sensacin de ardor en la vagina o la vulva.  Ardor o dolor al [employer]. DIAGNSTICO  Su mdico analizar su historia clnica y le examinar la vagina para detectar signos de vaginosis bacteriana. Puede tomarle Truddie Coco de flujo vaginal. Su mdico examinar esta muestra con un microscopio para controlar las bacterias y clulas anormales. Tambin puede realizarse un anlisis del pH vaginal.  TRATAMIENTO  La vaginosis bacteriana puede tratarse con antibiticos, en forma de comprimidos o de crema vaginal. Puede indicarse una segunda tanda de antibiticos si la afeccin se repite despus del  tratamiento.  [place] solo medicamentos de venta libre o recetados, segn las indicaciones del mdico.  Si le han recetado antibiticos, tmelos como se  le indic. Asegrese de que finaliza la prescripcin completa aunque se sienta mejor.  No mantenga relaciones sexuales Animator.  Comunique a sus compaeros sexuales que sufre una infeccin vaginal. Deben consultar a su mdico y recibir tratamiento si tienen problemas, como picazn o una erupcin cutnea leve.  Practique el sexo seguro usando preservativos y tenga un nico compaero sexual. SOLICITE ATENCIN MDICA SI:   Sus sntomas no mejoran despus de 3 das de [place].  Aumenta la secrecin o Conservation officer, historic buildings.  Tiene fiebre. ASEGRESE DE QUE:   Comprende estas instrucciones.  Controlar su afeccin.  Recibir ayuda de inmediato si no mejora o si empeora. PARA OBTENER MS INFORMACIN  Centros para el control y la prevencin de Probation officer for Disease Control and Prevention, CDC): AppraiserFraud.fi Asociacin Estadounidense de la Salud Sexual (American Sexual Health Association, SHA): www.ashastd.org  Document Released: 12/22/2007 Document Revised: 07/05/2013 [facility] Patient Information 2015 [place], [place]. This information is not intended to replace advice given to you by your health care provider. Make sure you discuss any questions you have with your health care provider.

## 2014-06-29 ENCOUNTER — Other Ambulatory Visit: Payer: Self-pay | Admitting: Gynecology

## 2014-06-29 ENCOUNTER — Ambulatory Visit (INDEPENDENT_AMBULATORY_CARE_PROVIDER_SITE_OTHER): Payer: BC Managed Care – PPO | Admitting: Gynecology

## 2014-06-29 ENCOUNTER — Ambulatory Visit (INDEPENDENT_AMBULATORY_CARE_PROVIDER_SITE_OTHER): Payer: BC Managed Care – PPO

## 2014-06-29 DIAGNOSIS — D251 Intramural leiomyoma of uterus: Secondary | ICD-10-CM

## 2014-06-29 DIAGNOSIS — N8320 Unspecified ovarian cysts: Secondary | ICD-10-CM

## 2014-06-29 DIAGNOSIS — R1031 Right lower quadrant pain: Secondary | ICD-10-CM

## 2014-06-29 DIAGNOSIS — N83201 Unspecified ovarian cyst, right side: Secondary | ICD-10-CM

## 2014-06-29 DIAGNOSIS — N923 Ovulation bleeding: Secondary | ICD-10-CM

## 2014-06-29 DIAGNOSIS — N938 Other specified abnormal uterine and vaginal bleeding: Secondary | ICD-10-CM

## 2014-06-29 DIAGNOSIS — N939 Abnormal uterine and vaginal bleeding, unspecified: Secondary | ICD-10-CM

## 2014-06-29 DIAGNOSIS — R102 Pelvic and perineal pain: Secondary | ICD-10-CM

## 2014-06-29 NOTE — Progress Notes (Signed)
   33 year old who was seen in the office on September 16 complaining of spotting for 8 days before her cycle and her periods last for 3 days thereafter. She states she would continue to spot for an additional days as well.She's not using any form of contraception. Her last office visit she was treated for bacterial vaginosis and wanted to start on oral contraceptive pills and was prescribed 1/20 on the second day of the start of her recent menstrual cycle. She was otherwise asymptomatic today.  Ultrasound today: Uterus measures 6.3 x 7.8 x 4.9 cm with endometrial stripe of 4.2 mm. A single right fundal fibroid measured 42 x 48 x 48 mm slightly increased last year was noted. Left ear was normal and right ovarian ankle free thin-walled avascular cyst was noted measuring 40 x 34 x 25 mm with a single thick septum. There was no fluid in the cul-de-sac. The cervix was then cleansed and benign solution and a sterile catheter was introduced into the uterine cavity. Normal saline was instilled into the uterine cavity and no intracavitary defects were noted.  Assessment/plan: Dysfunctional uterine bleeding probably was attributed to right ovarian cyst. Patient recently started oral contraceptive pill. I explained to her that the oral contraceptive pill would prevent ovarian cyst and ovarian cancer and regulate her cycles and cut down on her menstrual cramping. She will return back to the office in 3 months for followup ultrasound.

## 2014-07-30 ENCOUNTER — Encounter: Payer: Self-pay | Admitting: Gynecology

## 2014-08-09 ENCOUNTER — Encounter: Payer: BC Managed Care – PPO | Admitting: Gynecology

## 2014-08-10 ENCOUNTER — Encounter: Payer: BC Managed Care – PPO | Admitting: Gynecology

## 2014-08-13 ENCOUNTER — Encounter: Payer: BC Managed Care – PPO | Admitting: Gynecology

## 2014-09-12 ENCOUNTER — Other Ambulatory Visit (HOSPITAL_COMMUNITY)
Admission: RE | Admit: 2014-09-12 | Discharge: 2014-09-12 | Disposition: A | Discharge status: Home / Self Care | Payer: BC Managed Care – PPO | Source: Ambulatory Visit | Attending: Gynecology | Admitting: Gynecology

## 2014-09-12 ENCOUNTER — Encounter: Payer: Self-pay | Admitting: Gynecology

## 2014-09-12 ENCOUNTER — Ambulatory Visit (INDEPENDENT_AMBULATORY_CARE_PROVIDER_SITE_OTHER): Payer: BC Managed Care – PPO | Admitting: Gynecology

## 2014-09-12 DIAGNOSIS — Z1151 Encounter for screening for human papillomavirus (HPV): Secondary | ICD-10-CM | POA: Diagnosis present

## 2014-09-12 DIAGNOSIS — Z01419 Encounter for gynecological examination (general) (routine) without abnormal findings: Secondary | ICD-10-CM

## 2014-09-12 DIAGNOSIS — R635 Abnormal weight gain: Secondary | ICD-10-CM

## 2014-09-12 LAB — CBC WITH DIFFERENTIAL/PLATELET
BASOS PCT: 1 % (ref 0–1)
Basophils Absolute: 0 10*3/uL (ref 0.0–0.1)
EOS ABS: 0 10*3/uL (ref 0.0–0.7)
EOS PCT: 1 % (ref 0–5)
HCT: 39.7 % (ref 36.0–46.0)
Hemoglobin: 13.5 g/dL (ref 12.0–15.0)
Lymphocytes Relative: 33 % (ref 12–46)
Lymphs Abs: 1.3 10*3/uL (ref 0.7–4.0)
MCH: 26.5 pg (ref 26.0–34.0)
MCHC: 34 g/dL (ref 30.0–36.0)
MCV: 78 fL (ref 78.0–100.0)
MONO ABS: 0.3 10*3/uL (ref 0.1–1.0)
MONOS PCT: 8 % (ref 3–12)
MPV: 11 fL (ref 9.4–12.4)
Neutro Abs: 2.3 10*3/uL (ref 1.7–7.7)
Neutrophils Relative %: 57 % (ref 43–77)
Platelets: 274 10*3/uL (ref 150–400)
RBC: 5.09 MIL/uL (ref 3.87–5.11)
RDW: 14.8 % (ref 11.5–15.5)
WBC: 4 10*3/uL (ref 4.0–10.5)

## 2014-09-12 LAB — COMPREHENSIVE METABOLIC PANEL
ALT: 28 U/L (ref 0–35)
AST: 31 U/L (ref 0–37)
Albumin: 4.2 g/dL (ref 3.5–5.2)
Alkaline Phosphatase: 55 U/L (ref 39–117)
BUN: 9 mg/dL (ref 6–23)
CO2: 25 meq/L (ref 19–32)
CREATININE: 0.7 mg/dL (ref 0.50–1.10)
Calcium: 9.4 mg/dL (ref 8.4–10.5)
Chloride: 103 mEq/L (ref 96–112)
Glucose, Bld: 69 mg/dL — ABNORMAL LOW (ref 70–99)
Potassium: 3.6 mEq/L (ref 3.5–5.3)
SODIUM: 139 meq/L (ref 135–145)
TOTAL PROTEIN: 7.1 g/dL (ref 6.0–8.3)
Total Bilirubin: 0.4 mg/dL (ref 0.2–1.2)

## 2014-09-12 LAB — CHOLESTEROL, TOTAL: Cholesterol: 148 mg/dL (ref 0–200)

## 2014-09-12 MED ORDER — NORETHIN-ETH ESTRAD-FE BIPHAS 1 MG-10 MCG / 10 MCG PO TABS: 1.0000 | ORAL_TABLET | Freq: Every day | ORAL | Status: DC

## 2014-09-12 NOTE — Progress Notes (Signed)
Alisha Morris 1980-11-10 [id number]   History:    33 y.o.  for annual gyn exam with the only complaint today is one of weight gain since she started the oral contraceptive pill approximately 3 months ago. 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 Subsequent Pap smears were normal.last Pap smear 2013. Patient the past few months has had a new sexual partner. Patient declined flu vaccine today.  Past medical history,surgical history, family history and social history were all reviewed and documented in the EPIC chart.  Gynecologic History Patient's last menstrual period was 09/04/2014. Contraception: OCP (estrogen/progesterone) Last Pap: 2013. Results were: normal Last mammogram: Not indicated. Results were: Not indicated  Obstetric History OB History  Gravida Para Term Preterm AB SAB TAB Ectopic Multiple Living  1 1 1       1     # Outcome Date GA Lbr Len/2nd Weight Sex Delivery Anes PTL Lv  1 Term     M Vag-Spont  N Y       ROS: A ROS was performed and pertinent positives and negatives are included in the history.  GENERAL: No fevers or chills. HEENT: No change in vision, no earache, sore throat or sinus congestion. NECK: No pain or stiffness. CARDIOVASCULAR: No chest pain or pressure. No palpitations. PULMONARY: No shortness of breath, cough or wheeze. GASTROINTESTINAL: No abdominal pain, nausea, vomiting or diarrhea, melena or bright red blood per rectum. GENITOURINARY: No urinary frequency, urgency, hesitancy or dysuria. MUSCULOSKELETAL: No joint or muscle pain, no back pain, no recent trauma. DERMATOLOGIC: No rash, no itching, no lesions. ENDOCRINE: No polyuria, polydipsia, no heat or cold intolerance. No recent change in weight. HEMATOLOGICAL: No anemia or easy bruising or bleeding. NEUROLOGIC: No headache, seizures, numbness, tingling or  weakness. PSYCHIATRIC: No depression, no loss of interest in normal activity or change in sleep pattern.     Exam: chaperone present  BP 112/70 mmHg  Ht 4\' 11"  (1.499 m)  Wt 126 lb (57.153 kg)  BMI 25.44 kg/m2  LMP 09/04/2014  Body mass index is 25.44 kg/(m^2).  General appearance : Well developed well nourished female. No acute distress HEENT: Neck supple, trachea midline, no carotid bruits, no thyroidmegaly Lungs: Clear to auscultation, no rhonchi or wheezes, or rib retractions  Heart: Regular rate and rhythm, no murmurs or gallops Breast:Examined in sitting and supine position were symmetrical in appearance, no palpable masses or tenderness,  no skin retraction, no nipple inversion, no nipple discharge, no skin discoloration, no axillary or supraclavicular lymphadenopathy Abdomen: no palpable masses or tenderness, no rebound or guarding Extremities: no edema or skin discoloration or tenderness  Pelvic:  Bartholin, Urethra, Skene Glands: Within normal limits             Vagina: No gross lesions or discharge  Cervix: No gross lesions or discharge  Uterus  anteverted, normal size, shape and consistency, non-tender and mobile  Adnexa  Without masses or tenderness  Anus and perineum  normal   Rectovaginal  normal sphincter tone without palpated masses or tenderness             Hemoccult not indicated     Assessment/Plan:  33 y.o. female for annual exam who has gaining weight on the 20 g oral contraceptive pill 3 months ago but otherwise has done well. We are going to change her to a 10  g pill (lo  loestrin). She was reminded of the importance of monthly breast exam. Pap smear with HPV screening was done today since patient had a new partner. The following labs were ordered: CBC, screening cholesterol, compresses a metabolic panel, TSH, and urinalysis.   Terrance Mass MD, 5:14 PM 09/12/2014

## 2014-09-12 NOTE — Patient Instructions (Signed)
Ejercicios para perder peso (Exercise to Lose Weight) La actividad fsica y una dieta saludable ayudan a perder peso. El mdico podr sugerirle ejercicios especficos. IDEAS Y CONSEJOS PARA HACER EJERCICIOS  Elija opciones econmicas que disfrute hacer , como caminar, andar en bicicleta o los vdeos para ejercitarse.   Utilice las escaleras en lugar del ascensor.   Camine durante la hora del almuerzo.   Estacione el auto lejos del lugar de trabajo o estudio.   Concurra a un gimnasio o tome clases de gimnasia.   Comience con 5  10 minutos de actividad fsica por da. Ejercite hasta 30 minutos, 4 a 6 das por semana.   Utilice zapatos que tengan un buen soporte y ropas cmodas.   Elongue antes y despus de ejercitar.   Ejercite hasta que aumente la respiracin y el corazn palpite rpido.   Beba agua extra cuando ejercite.   No haga ejercicio hasta lastimarse, sentirse mareado o que le falte mucho el aire.  La actividad fsica puede quemar alrededor de 150 caloras.  Correr 20 cuadras en 15 minutos.   Jugar vley durante 45 a 60 minutos.   Limpiar y encerar el auto durante 45 a 60 minutos.   Jugar ftbol americano de toque.   Caminar 25 cuadras en 35 minutos.   Empujar un cochecito 20 cuadras en 30 minutos.   Jugar baloncesto durante 30 minutos.   Rastrillar hojas secas durante 30 minutos.   Andar en bicicleta 80 cuadras en 30 minutos.   Caminar 30 cuadras en 30 minutos.   Bailar durante 30 minutos.   Quitar la nieve con una pala durante 15 minutos.   Nadar vigorosamente durante 20 minutos.   Subir escaleras durante 15 minutos.   Andar en bicicleta 60 cuadras durante 15 minutos.   Arreglar el jardn entre 30 y 45 minutos.   Saltar a la soga durante 15 minutos.   Limpiar vidrios o pisos durante 45 a 60 minutos.  Document Released: 12/19/2010 Document Revised: 05/27/2011 ExitCare Patient Information 2012 [employer]. 

## 2014-09-13 LAB — URINALYSIS W MICROSCOPIC + REFLEX CULTURE
Bacteria, UA: NONE SEEN
Bilirubin Urine: NEGATIVE
CASTS: NONE SEEN
Crystals: NONE SEEN
Glucose, UA: NEGATIVE mg/dL
HGB URINE DIPSTICK: NEGATIVE
Ketones, ur: NEGATIVE mg/dL
Leukocytes, UA: NEGATIVE
Nitrite: NEGATIVE
Protein, ur: NEGATIVE mg/dL
Specific Gravity, Urine: 1.011 (ref 1.005–1.030)
Squamous Epithelial / LPF: NONE SEEN
Urobilinogen, UA: 0.2 mg/dL (ref 0.0–1.0)
pH: 8 (ref 5.0–8.0)

## 2014-09-13 LAB — TSH: TSH: 0.783 u[IU]/mL (ref 0.350–4.500)

## 2014-09-17 LAB — CYTOLOGY - PAP

## 2014-10-05 ENCOUNTER — Ambulatory Visit (INDEPENDENT_AMBULATORY_CARE_PROVIDER_SITE_OTHER): Payer: BLUE CROSS/BLUE SHIELD

## 2014-10-05 ENCOUNTER — Other Ambulatory Visit: Payer: BC Managed Care – PPO

## 2014-10-05 ENCOUNTER — Ambulatory Visit (INDEPENDENT_AMBULATORY_CARE_PROVIDER_SITE_OTHER): Payer: BLUE CROSS/BLUE SHIELD | Admitting: Gynecology

## 2014-10-05 ENCOUNTER — Ambulatory Visit: Payer: BC Managed Care – PPO | Admitting: Gynecology

## 2014-10-05 ENCOUNTER — Encounter: Payer: Self-pay | Admitting: Gynecology

## 2014-10-05 VITALS — BP 116/78

## 2014-10-05 DIAGNOSIS — R102 Pelvic and perineal pain: Secondary | ICD-10-CM

## 2014-10-05 DIAGNOSIS — N8320 Unspecified ovarian cysts: Secondary | ICD-10-CM

## 2014-10-05 DIAGNOSIS — D251 Intramural leiomyoma of uterus: Secondary | ICD-10-CM

## 2014-10-05 DIAGNOSIS — N83201 Unspecified ovarian cyst, right side: Secondary | ICD-10-CM

## 2014-10-05 MED ORDER — NORETHIN ACE-ETH ESTRAD-FE 1-20 MG-MCG PO TABS: 1.0000 | ORAL_TABLET | Freq: Every day | ORAL | Status: DC

## 2014-10-05 NOTE — Patient Instructions (Signed)
Laparoscopa diagnstica (Diagnostic Laparoscopy) La laparoscopa es un procedimiento quirrgico relativamente simple, de uso habitual y breve (menos de una hora) que se lleva a cabo para diagnosticar y tratar enfermedades del abdomen. El laparoscopio (tubo delgado, que emite luz, del tamao de un lpiz y similar a un telescopio) se inserta en el abdomen a travs de una pequea incisin (corte realizado por un cirujano). A travs de este instrumento, el profesional podr observar [employer] rganos del interior del abdomen (vientre) y ver si hay algo anormal. La laparoscopa podr llevarse a cabo tanto en el hospital como en un consultorio. Podrn administrarle un sedante suave que lo ayudar a relajarse antes y durante el procedimiento. Una vez en la sala de operaciones, le administrarn una anestesia general (a menos que usted y el profesional elijan otro tipo de anestesia). Despus de la laparoscopa, que generalmente dura menos de Leone Brand, ser [employer] sala de recuperacin durante algunas horas. Cuando regrese a Medical illustrator, la Hotel manager [employer] y [place]. RIESGOS Y COMPLICACIONES Comparados con los beneficios, los riesgos de la laparoscopia son relativamente pocos. El Management consultant con usted los riesgos antes del procedimiento. Algunos problemas que pueden ocurrir luego de la intervencin son:  Infecciones.  Hemorragias.  Puede ocurrir que se lesionen otros rganos.  Efectos secundarios de Architect. PROCEDIMIENTO Una vez que se encuentra anestesiado, el cirujano insufla el abdomen con un gas inofensivo (dixido de carbono) para Museum/gallery exhibitions officer observacin de los rganos de la pelvis. El cirujano introduce el laparoscopio a travs de una pequea incisin en el ombligo o alrededor del mismo. Podr insertar otros instrumentos, como una sonda para mover los rganos o Optometrist algn procedimiento a travs de otra pequea incisin.  En ocasiones se  toma una biopsia (muestra de tejido) para un diagnstico ms preciso o para Conservator, museum/gallery. La biopsia consiste en tomar una pequea muestra de tejido durante la laparoscopia para enviarlo al patlogo (especialista en la observacin de clulas y muestras de tejido) y que lo examine en el microscopio para un diagnstico a nivel de los tejidos. DESPUES DEL PROCEDIMIENTO  Se libera el gas del abdomen.  Las incisiones se cierran con puntos (suturas). Debido a que las incisiones son pequeas (generalmente de menos de 1 cm) las molestias son mnimas luego del procedimiento. Es posible que sienta cierto Loss adjuster, chartered. Es consecuencia de Programmer, systems del tubo mientras se encontraba dormido. Es posible que sienta algn dolor abdominal no muy intenso. Tambin podr sentir [employer] en las incisiones realizadas para insertar los instrumentos en el abdomen.  El tiempo de recuperacin es reducido, siempre que no haya habido complicaciones.  Har reposo en la sala de recuperacin hasta que se encuentre estable y se sienta bien. Si no aparecen complicaciones, podr regresar a su casa. AVERIGE LOS RESULTADOS DE SU ANLISIS Durante su visita no contar con todos los [employer]. En este caso, tenga otra entrevista con su mdico para conocerlos. No piense que el resultado es normal si no tiene noticias de su mdico o de la institucin mdica. Es Building services engineer seguimiento de todos los [place] de [place]. INSTRUCCIONES [place] todos los medicamentos tal como se le indic.  Utilice los medicamentos de venta libre o de prescripcin para Conservation officer, historic buildings, Health and safety inspector o la [place], segn se lo indique el profesional que lo asiste.  Reanude las actividades habituales cuando se le indique.  Es preferible que se duche  y no tome baos de inmersin.  Reanude la actividad sexual luego de [place], o cuando lo autoricen.  No conduzca mientras se encuentre  bajo los efectos de narcticos. SOLICITE ATENCIN MDICA SI:  Siente un dolor abdominal inexplicable.  Siente dolor en los hombros, en la regin de los tirantes.  Si se siente aturdido o siente que se va a Insurance account manager.  Siente escalofros.  Usted o su nio tienen una temperatura oral de ms de 38,9 C (102 F).  Observa un drenaje purulento (similar al pus) que proviene de alguna de las heridas.  Usted o su hijo no puede realizar movimientos intestinales o evacuar gases.  Usted o su hijo sufren nuseas o vmitos. EST SEGURO QUE:   Comprende las instrucciones para el alta mdica.  Controlar su enfermedad.  Solicitar atencin mdica de inmediato segn las indicaciones. Document Released: 09/14/2005 Document Revised: 12/07/2011 [facility] Patient Information 2015 [place]. This information is not intended to replace advice given to you by your health care provider. Make sure you discuss any questions you have with your health care provider. Quiste ovrico (Ovarian Cyst) Un quiste ovrico es una bolsa llena de lquido que se forma en el ovario. Los ovarios son los rganos pequeos que producen vulos en las mujeres. Se pueden formar varios tipos de [employer]. Benito Mccreedy no son cancerosos. Muchos de ellos no causan problemas y con frecuencia desaparecen solos. Algunos pueden provocar sntomas y requerir Clinical research associate. Los tipos ms comunes de quistes ovricos son los siguientes:  Quistes funcionales: estos quistes pueden aparecer todos los meses durante el ciclo menstrual. Esto es normal. Estos quistes suelen desaparecer con el prximo ciclo menstrual si la mujer no queda embarazada. En general, los quistes funcionales no tienen sntomas.  Endometriomas: estos quistes se forman a partir del tejido que recubre el tero. Tambin se denominan "quistes de chocolate" porque se llenan de sangre que se vuelve marrn. Este tipo de quiste puede Engineer, production en la zona inferior  del abdomen durante la relacin sexual y con el perodo menstrual.  Cistoadenomas: este tipo se desarrolla a partir de las clulas que se [place] en el exterior del ovario. Estos quistes pueden ser muy grandes y causar dolor en la zona inferior del abdomen y durante la relacin sexual. Cindra Presume tipo de quiste puede girar sobre s mismo, cortar el suministro de Biochemist, clinical y causar un dolor intenso. Tambin se puede romper con facilidad y Stage manager.  Quistes dermoides: este tipo de quiste a veces se encuentra en ambos ovarios. Estos quistes pueden [employer] tipos de tejidos del organismo, como piel, dientes, pelo o Database administrator. Generalmente no tienen sntomas, a menos que sean muy grandes.  Quistes tecalutenicos: aparecen cuando se produce demasiada cantidad de cierta hormona (gonadotropina corinica humana) que estimula en exceso al ovario para que produzca vulos. Esto es ms frecuente despus de procedimientos que ayudan a la concepcin de un beb (fertilizacin in vitro). CAUSAS   Los medicamentos para la fertilidad pueden provocar una afeccin mediante la cual se forman mltiples quistes de gran tamao en los ovarios. Esta se denomina sndrome de hiperestimulacin ovrica.  El sndrome del ovario poliqustico es una afeccin que puede causar desequilibrios hormonales, los cuales pueden dar como resultado quistes ovricos no funcionales. SIGNOS Y SNTOMAS  Muchos quistes ovricos no causan sntomas. Si se presentan sntomas, stos pueden ser:  Dolor o molestias en la pelvis.  Dolor en la parte baja del abdomen.  [place].  Aumento  del permetro abdominal (hinchazn).  Perodos menstruales anormales.  Aumento del [employer] perodos [place].  Cese de los perodos menstruales sin estar embarazada. DIAGNSTICO  Estos quistes se descubren comnmente durante un examen de rutina o una exploracin ginecolgica anual. Es posible que se ordenen  otros estudios para obtener ms informacin sobre el [place]. Estos estudios pueden ser:  Engineer, materials.  Radiografas de la pelvis.  Tomografa computada.  Resonancia magntica.  Anlisis de [place]. TRATAMIENTO  Muchos de los quistes ovricos desaparecen por s solos, sin tratamiento. Es probable que el mdico quiera controlar el quiste regularmente durante 2 o 47meses para ver si se produce algn cambio. En el caso de las mujeres en la menopausia, es particularmente importante controlar de cerca al quiste ya que el ndice de cncer de ovario en las mujeres menopusicas es ms alto. Cuando se requiere Clinical research associate, este puede incluir cualquiera de los siguientes:  Un procedimiento para drenar el quiste (aspiracin). Esto se puede realizar [employer] uso de [place] grande y [place]. Tambin se puede hacer a travs de un procedimiento laparoscpico, En este procedimiento, se inserta un tubo delgado que emite luz y que tiene una pequea cmara en un extremo (laparoscopio) a travs de una pequea incisin.  Ciruga para extirpar el quiste completo. Esto se puede realizar mediante una ciruga laparoscpica o Ardelia Mems ciruga abierta, la cual implica realizar una incisin ms grande en la parte inferior del abdomen.  Tratamiento hormonal o pldoras anticonceptivas. Estos mtodos a veces se usan para ayudar a Writer. [place] solo medicamentos de venta libre o recetados, segn las indicaciones del mdico.  Consulting civil engineer a las consultas de control con su mdico segn las indicaciones.  Hgase exmenes plvicos regulares y pruebas de [place]apan[place]ola[place]. SOLICITE ATENCIN MDICA SI:   Los perodos se atrasan, son irregulares, dolorosos o cesan.  El dolor plvico o abdominal no desaparece.  El abdomen se agranda o se hincha.  Siente presin en la vejiga o no puede vaciarla completamente.  Siente dolor durante las [employer].  Tiene una  sensacin de hinchazn, presin o Manufacturing systems engineer.  Pierde peso sin razn aparente.  Siente un Pharmacist, hospital.  Est estreida.  Pierde el apetito.  Le aparece acn.  Nota un aumento del vello corporal y facial.  Elenore Rota de peso sin hacer modificaciones en su actividad fsica y en su dieta habitual.  Sospecha que est embarazada. SOLICITE ATENCIN MDICA DE INMEDIATO SI:   Siente cada vez ms dolor abdominal.  Tiene malestar estomacal (nuseas) y vomita.  Tiene fiebre que se presenta de [place] repentina.  Siente dolor abdominal al defecar.  Sus perodos menstruales son ms abundantes que lo habitual. ASEGRESE DE QUE:   Comprende estas instrucciones.  Controlar su afeccin.  Recibir ayuda de inmediato si no mejora o si empeora. Document Released: 06/24/2005 Document Revised: 09/19/2013 [facility] Patient Information 2015 [place]. This information is not intended to replace advice given to you by your health care provider. Make sure you discuss any questions you have with your health care provider.

## 2014-10-05 NOTE — Progress Notes (Signed)
   Patient is a 34 year old who presented to the office today to discuss her ultrasound. Patient had been seen in September 2015 was having some irregular menses and was started on Junel 1/29 oral contraceptive pill and was treated for bacterial vaginosis and return to the office for sonohysterogram on 06/29/2014 with the following result:  Ultrasound today: Uterus measures 6.3 x 7.8 x 4.9 cm with endometrial stripe of 4.2 mm. A single right fundal fibroid measured 42 x 48 x 48 mm slightly increased last year was noted. Left ear was normal and right ovarian echo  free thin-walled avascular cyst was noted measuring 40 x 34 x 25 mm with a single thick septum. There was no fluid in the cul-de-sac. The cervix was then cleansed and benign solution and a sterile catheter was introduced into the uterine cavity. Normal saline was instilled into the uterine cavity and no intracavitary defects were noted.  She was to return today for follow-up ultrasound which is the reason for today's visit. She has had occasional right lower quadrant discomfort for quite some time. She called the office that wanted to change her oral contraceptive pill because she had been gaining weight but when she found out that the 10 g tablet was in [place] more money she would like to have refill of the one that she is currently on now that she is having normal menstrual cycles and is tolerating well.  Ultrasound today: Uterus measured 8.4 x 7.5 x 6.37 m within reach or strep a 7.5 mm. A single right fundal fibroid measuring 4.1 x 3.8 cm was noted left ovary appears to be normal . 2 right ovarian cysts were noted 1 was complex thick wall avascular measuring 3.1 x 3.4 x 1.8 cm with a second one echo-free thinwall avascular cyst measuring 3.2 x 1.8 cm with no fluid in the cul-de-sac  Assessment/plan: Persistent right ovarian cyst not it appears to be 2 on that ovary. Appears benig . Patient given option to proceed diagnostic laparoscopy and  right ovarian cystectomy or proceed with a CA 125 today and follow-up ultrasound in 3 months and of cystoscopy present proceed with laparoscopic cystectomy. Patient has opted for the latter. Literature information was provided. She will continue on her Junel 1/20 oral contraceptive pill.

## 2014-10-06 LAB — CA 125: CA 125: 37 U/mL — ABNORMAL HIGH (ref <35)

## 2014-10-08 ENCOUNTER — Other Ambulatory Visit: Payer: Self-pay | Admitting: Gynecology

## 2014-10-08 DIAGNOSIS — R971 Elevated cancer antigen 125 [CA 125]: Secondary | ICD-10-CM

## 2015-01-04 ENCOUNTER — Other Ambulatory Visit: Payer: BLUE CROSS/BLUE SHIELD

## 2015-01-04 ENCOUNTER — Ambulatory Visit: Payer: BLUE CROSS/BLUE SHIELD | Admitting: Gynecology

## 2015-01-09 ENCOUNTER — Ambulatory Visit (INDEPENDENT_AMBULATORY_CARE_PROVIDER_SITE_OTHER): Payer: BLUE CROSS/BLUE SHIELD | Admitting: Gynecology

## 2015-01-09 ENCOUNTER — Encounter: Payer: Self-pay | Admitting: Gynecology

## 2015-01-09 ENCOUNTER — Other Ambulatory Visit: Payer: Self-pay | Admitting: Gynecology

## 2015-01-09 ENCOUNTER — Ambulatory Visit (INDEPENDENT_AMBULATORY_CARE_PROVIDER_SITE_OTHER): Payer: BLUE CROSS/BLUE SHIELD

## 2015-01-09 VITALS — BP 116/78

## 2015-01-09 DIAGNOSIS — N839 Noninflammatory disorder of ovary, fallopian tube and broad ligament, unspecified: Secondary | ICD-10-CM | POA: Diagnosis not present

## 2015-01-09 DIAGNOSIS — D252 Subserosal leiomyoma of uterus: Secondary | ICD-10-CM

## 2015-01-09 DIAGNOSIS — N838 Other noninflammatory disorders of ovary, fallopian tube and broad ligament: Secondary | ICD-10-CM

## 2015-01-09 DIAGNOSIS — B373 Candidiasis of vulva and vagina: Secondary | ICD-10-CM | POA: Diagnosis not present

## 2015-01-09 DIAGNOSIS — N83201 Unspecified ovarian cyst, right side: Secondary | ICD-10-CM

## 2015-01-09 DIAGNOSIS — D250 Submucous leiomyoma of uterus: Secondary | ICD-10-CM | POA: Insufficient documentation

## 2015-01-09 DIAGNOSIS — B3731 Acute candidiasis of vulva and vagina: Secondary | ICD-10-CM

## 2015-01-09 DIAGNOSIS — N8320 Unspecified ovarian cysts: Secondary | ICD-10-CM | POA: Diagnosis not present

## 2015-01-09 DIAGNOSIS — D251 Intramural leiomyoma of uterus: Secondary | ICD-10-CM | POA: Diagnosis not present

## 2015-01-09 DIAGNOSIS — N83202 Unspecified ovarian cyst, left side: Principal | ICD-10-CM

## 2015-01-09 LAB — WET PREP FOR TRICH, YEAST, CLUE
CLUE CELLS WET PREP: NONE SEEN
TRICH WET PREP: NONE SEEN

## 2015-01-09 MED ORDER — FLUCONAZOLE 150 MG PO TABS: 150.0000 mg | ORAL_TABLET | Freq: Once | ORAL | Status: DC

## 2015-01-09 NOTE — Addendum Note (Signed)
Addended by: Thurnell Garbe A on: 01/09/2015 02:36 PM   Modules accepted: Orders

## 2015-01-09 NOTE — Progress Notes (Signed)
   Patient is a 34 year old who on 06/29/2014 had an ultrasound as a result of her dysfunctional uterine bleeding and her ultrasound and sonohysterogram demonstrated the following:  Uterus measures 6.3 x 7.8 x 4.9 cm with endometrial stripe of 4.2 mm. A single right fundal fibroid measured 42 x 48 x 48 mm slightly increased last year was noted. Left ear was normal and right ovarian ankle free thin-walled avascular cyst was noted measuring 40 x 34 x 25 mm with a single thick septum. There was no fluid in the cul-de-sac. The cervix was then cleansed and benign solution and a sterile catheter was introduced into the uterine cavity. Normal saline was instilled into the uterine cavity and no intracavitary defects were noted.  She reports no irregular bleeding now and is on a 20 g oral contraceptive pill it was believed irregular bleeding may be attributed to the right ovarian cyst and she was instructed to return to the office for follow-up ultrasound 3 months after the last visit and she has returned today. Patient today was complain some vulvar pruritus as well. Patient in a monogamous relationship.  Ultrasound today demonstrated the following: Uterus measured 8.0 x 8.0 x 3.8 cm endometrial stripe 6.2 mm (last menstrual period March 20 13,016). Patient with a right solid fundal fibroid measuring 4.9 x 4.3 x 4.6 cm which has remained the same size since last year. A thin-walled cystic reticular echo cyst measuring 3.3 x 2.7 x 3.3 cm with positive color flow in the periphery was noted slightly decreased from last year. Arterial blood flow was noted. Left ovary there was an echo-free cyst measuring 2.2 x 2.3 cm along with a thick walled cystic solid mass measuring 4.2 x 2.8 x 3.6 cm average size 3.6 cm which was not present last year. Positive color flow the periphery was noted. Blood flow the ovary was evident. No fluid in the cul-de-sac.  Wet prep: Moderate yeast  Assessment/plan: Patient with bilateral  ovarian cysts previously seen right ovarian cyst noted in 2015 has slightly decreased in size. Left ovarian cyst noted  CA 125 was 36. Patient adamant about having surgery. We had discussed laparoscopic ovarian cystectomy. She would like to wait a few more months. We will have her finish her current 2 weeks left of her oral contraceptive pill. When she starts menstruating she will come to the office for the Depo-Provera injection. We'll then repeat the ultrasound in 3 months if cysts are still present I would recommend we follow-up with laparoscopic cystectomy. We had also discussed open laparotomy and doing a myomectomy at the same time for which she would like to think about. Literature information was provided in [place]. For her yeast vaginitis she'll be prescribed Diflucan 150 mg 1 by mouth today.

## 2015-01-09 NOTE — Patient Instructions (Addendum)
Quistectoma ovrica (Ovarian Cystectomy) La quistectoma ovrica es una ciruga que se realiza para extirpar una bolsa llena de lquido (quiste) de un ovario. Los ovarios son los rganos pequeos que producen vulos en las mujeres. Se pueden formar varios tipos de Alisha Morris. Alisha Morris no son cancerosos. Posiblemente se realice una ciruga si el quiste es grande o causa sntomas como dolor. Tambin se puede Public affairs consultant caso de que el quiste sea canceroso o exista la posibilidad de que lo sea. Esta ciruga se puede realizar mediante una tcnica laparoscpica o una tcnica de abdomen abierto. La tcnica laparoscpica involucra cortes ms pequeos (incisiones) y un proceso de recuperacin ms rpido. La tcnica que se use depender de la edad de la Alisha Morris, el tipo de quiste y si este es canceroso. La tcnica laparoscpica no se [place] para quistes cancerosos. INFORME A SU MDICO:   Cualquier alergia que tenga.  Todos los Alisha Morris, incluidos vitaminas, hierbas, gotas oftlmicas, cremas y medicamentos de venta libre.  Problemas previos que usted o los Alisha Morris de su familia hayan tenido con el uso de anestsicos.  Enfermedades de Alisha Morris.  Cirugas previas.  Enfermedades patolgicas.  Cualquier posibilidad de que pueda Alisha Morris. RIESGOS Y COMPLICACIONES En general, se trata de un procedimiento seguro. Sin embargo, Alisha Morris procedimiento, pueden surgir complicaciones. Las complicaciones posibles son:  Alisha Morris.  Infeccin.  Lesiones en otros rganos.  Cogulos sanguneos.  Imposibilidad para quedar embarazada (infertilidad). ANTES DEL PROCEDIMIENTO  Consulte a su mdico si debe cambiar o suspender los medicamentos que toma habitualmente. Evite tomar aspirinas, ibuprofeno o anticoagulantes, segn las indicaciones del mdico.  No coma ni beba nada despus de la medianoche anterior a la ciruga.  Si fuma, no lo haga [employer] previas a la [place], como mnimo.  No beba alcohol el da anterior a la [place].  Infrmele al mdico si contrae un resfro o alguna infeccin antes de la [place].  Pdale a alguien que lo lleve a su casa despus del procedimiento o de la hospitalizacin. Tambin pdale a alguna persona que lo ayude con sus actividades mientras se recupera. PROCEDIMIENTO  Para esta ciruga se puede usar tanto la tcnica laparoscpica como la tcnica de abdomen abierto.  Le colocarn pequeos monitores en el cuerpo. Estos controlarn su corazn, la presin arterial y Retail buyer de oxgeno.  Se le colocar un acceso intravenoso en una de las venas. A travs de esta va intravenosa (IV) los medicamentos pasarn directamente hacia el cuerpo.  Es posible que le administren un medicamento para ayudarlo a Nurse, children's (sedante).  Le administrarn un medicamento que la har dormir (anestesia general). Durante el procedimiento, posiblemente le coloquen un respirador. Tcnica laparoscpica  Le harn varios cortes pequeos (incisiones) en el abdomen. Normalmente tienen entre un 1,5 y 2 centmetros de longitud.  Se llenar el abdomen de gas de dixido de carbono para expandirlo. Esto permite que el cirujano tenga ms espacio para operar y Copy visualizacin de los rganos.  Le insertarn un tubo delgado luminoso, que tiene una pequea cmara en el extremo (laparoscopio) a travs de una de las pequeas incisiones. La cmara del laparoscopio enva una imagen a una pantalla de televisin que se encuentra en el quirfano. De este modo, el cirujano tendr Alisha Morris buena visin del interior del abdomen.  A travs de las otras pequeas incisiones del abdomen se insertan tubos huecos. A travs de estos tubos se coloca el instrumental necesario para los procedimientos.  Se identifica el ovario con el quiste y [place] se extirpa y se enva al laboratorio para que sea examinado. Si se trata de cncer, posiblemente se deban  extirpar ambos ovarios en una ciruga diferente.  Se retiran los instrumentos y luego se cierran las incisiones con puntos o pegamento para la piel, y probablemente se coloquen vendajes. Tcnica de abdomen abierto  Se realiza una sola incisin grande a lo largo de la lnea del biquini o en el medio de la zona inferior del abdomen.  Se identifica el ovario con el quiste y [place] se extirpa y se enva al laboratorio para que sea examinado. Si se trata de cncer, posiblemente se deban extirpar ambos ovarios en una ciruga diferente.  Luego se cierra la incisin con puntos o grapas. DESPUS DEL PROCEDIMIENTO   Una vez que despierte de la anestesia la trasladarn a una sala de recuperacin.  Si se someti a Chiropractor, es posible que pueda volver a su casa el mismo da del procedimiento, o puede Chiropractor en observacin en el Morris durante la noche.  Si se someti a una ciruga abdominal, deber [employer].  Le retirarn la va intravenosa y el [place] despus del procedimiento, una vez que pueda comer y beber lo suficiente.  Es posible que le den medicamentos para Best boy o para que pueda dormir.  De ser necesario, probablemente le den antibiticos. Document Released: 07/05/2013 Alisha Morris Patient Information 2015 Alisha Morris. This information is not intended to replace advice given to you by your health care provider. Make sure you discuss any questions you have with your health care provider. Uterine Fibroid A uterine fibroid is a growth (tumor) that occurs in your uterus. This type of tumor is not cancerous and does not spread out of the uterus. You can have one or many fibroids. Fibroids can vary in size, weight, and where they grow in the uterus. Some can become quite large. Most fibroids do not require medical treatment, but some can cause pain or heavy bleeding during and between periods. CAUSES  A  fibroid is the result of a single uterine cell that keeps growing (unregulated), which is different than most cells in the human body. Most cells have a control mechanism that keeps them from reproducing without control.  SIGNS AND SYMPTOMS   Bleeding.  Pelvic pain and pressure.  Bladder problems due to the size of the fibroid.  Infertility and miscarriages depending on the size and location of the fibroid. DIAGNOSIS  Uterine fibroids are diagnosed through a physical exam. Your health care provider may feel the lumpy tumors during a pelvic exam. Ultrasonography may be done to get information regarding size, location, and number of tumors.  TREATMENT   Your health care provider may recommend watchful waiting. This involves getting the fibroid checked by your health care provider to see if it grows or shrinks.   Hormone treatment or an intrauterine device (IUD) may be prescribed.   Surgery may be needed to remove the fibroids (myomectomy) or the uterus (hysterectomy). This depends on your situation. When fibroids interfere with fertility and a woman wants to become pregnant, a health care provider may recommend having the fibroids removed.  Aristes care depends on how you were treated. In general:   Keep all follow-up appointments with your health care provider.   Only take over-the-counter or prescription medicines as directed by your health care provider. If  you were prescribed a hormone treatment, take the hormone medicines exactly as directed. Do not take aspirin. It can cause bleeding.   Talk to your health care provider about taking iron pills.  If your periods are troublesome but not so heavy, lie down with your feet raised slightly above your heart. Place cold packs on your lower abdomen.   If your periods are heavy, write down the number of pads or tampons you use per month. Bring this information to your health care provider.   Include green  vegetables in your diet.  SEEK IMMEDIATE MEDICAL CARE IF:  You have pelvic pain or cramps not controlled with medicines.   You have a sudden increase in pelvic pain.   You have an increase in bleeding between and during periods.   You have excessive periods and soak tampons or pads in a half hour or less.  You feel lightheaded or have fainting episodes. Document Released: 09/11/2000 Document Revised: 07/05/2013 Document Reviewed: 04/13/2013 Alisha Morris Patient Information 2015 Alisha Morris, Alisha Morris. This information is not intended to replace advice given to you by your health care provider. Make sure you discuss any questions you have with your health care provider. Vaginitis monilisica (Monilial Vaginitis) La vaginitis es una inflamacin (irritacin, hinchazn) de la vagina y la vulva. Esta no es una enfermedad de transmisin sexual.  CAUSAS Este tipo de vaginitis lo causa un hongo (candida) que normalmente se encuentra en la vagina. El hongo candida se ha desarrollado hasta el punto de ocasionar problemas en el equilibrio qumico. SNTOMAS  Secrecin vaginal espesa y blanca.  Hinchazn, picazn, enrojecimiento e inflamacin de la vagina y en algunos casos de los labios vaginales (vulva).  Ardor o dolor al [employer].  Dolor en [place]. DIAGNSTICO Los factores que favorecen la vaginitis moniliasica son:  Kyla Balzarine de virginidad y postmenopusicas.  Embarazo.  Infecciones.  Sentir cansancio, estar enferma o estresada, especialmente si ya ha sufrido este problema en el pasado.  Diabetes Buen control ayudar a disminur la probabilidad.  Pldoras anticonceptivas  Ropa interior [place].  El uso de espumas de bao, aerosoles femeninos duchas vaginales o tampones con desodorante.  Algunos antibiticos (medicamentos que destruyen grmenes).  Si contrae alguna enfermedad puede sufrir recurrencias espordicas. [place] profesional que lo asiste prescribir  medicamentos.  Hay diferentes tipos de cremas y supositorios vaginales que tratan especficamente la vaginitis monilisica. Para infecciones por hongos recurrentes, utilice un supositorio o crema en la vagina dos veces por semana, o segn se le indique.  Tambin podrn utilizarse cremas con corticoides o anti monilisicas para la picazn o la irritacin de la vulva. Consulte con el profesional que la asiste.  Si la crema no da resultado, podr aplicarse en la vagina una solucin con azul de metileno.  El consumo de yogur puede prevenir este tipo de vaginitis. INSTRUCCIONES [place] todos los medicamentos tal como se le indic.  No mantenga relaciones sexuales hasta que el tratamiento se haya completado, o segn las indicaciones del profesional que la asiste.  Tome baos de asiento tibios.  No se aplique duchas vaginales.  No utilice tampones, especialmente los perfumados.  Use ropa interior de algodn  [employer] pantalones ajustados y las medias tipo panty.  Comunique a sus compaeros sexuales que sufre una infeccin por hongos. Ellos deben concurrir para un control mdico si tienen sntomas como una urticaria leve o picazn.  Sus compaeros sexuales deben tratarse tambin si la infeccin es difcil de Radiographer, therapeutic.  Practique  el sexo seguro - use condones  Algunos medicamentos vaginales ocasionan fallas en los condones de ltex. Los medicamentos vaginales que pueden daar los condones son:  Building services engineer cleocina  Butoconazole (Femstat)  Terconazole (Terazol) supositorios vaginales  Miconazole (Monistat) (es un medicamento de venta libre) SOLICITE ATENCIN MDICA SI:  Waldron Session tiene una temperatura oral de ms de 38,9 C (102 F).  Si la infeccin empeora luego de 2 das de tratamiento.  Si la infeccin no mejora luego de 3 das de tratamiento.  Aparecen ampollas en o alrededor de la vagina.  Si aparece una hemorragia vaginal y no es el momento del  perodo.  Siente dolor al [employer].  Presenta problemas intestinales.  Tiene dolor durante las [employer]. Document Released: 06/24/2005 Document Revised: 12/07/2011 [facility] Patient Information 2015 [place]. This information is not intended to replace advice given to you by your health care provider. Make sure you discuss any questions you have with your health care provider.

## 2015-01-21 ENCOUNTER — Ambulatory Visit: Payer: BLUE CROSS/BLUE SHIELD

## 2015-01-22 ENCOUNTER — Ambulatory Visit (INDEPENDENT_AMBULATORY_CARE_PROVIDER_SITE_OTHER): Payer: BLUE CROSS/BLUE SHIELD | Admitting: Anesthesiology

## 2015-01-22 DIAGNOSIS — N8320 Unspecified ovarian cysts: Secondary | ICD-10-CM | POA: Diagnosis not present

## 2015-01-22 DIAGNOSIS — N83201 Unspecified ovarian cyst, right side: Secondary | ICD-10-CM

## 2015-01-22 MED ORDER — MEDROXYPROGESTERONE ACETATE 150 MG/ML IM SUSP
150.0000 mg | Freq: Once | INTRAMUSCULAR | Status: AC
  Administered 2015-01-22: 150 mg via INTRAMUSCULAR

## 2015-04-24 ENCOUNTER — Encounter: Payer: Self-pay | Admitting: Gynecology

## 2015-04-24 ENCOUNTER — Ambulatory Visit (INDEPENDENT_AMBULATORY_CARE_PROVIDER_SITE_OTHER): Payer: BLUE CROSS/BLUE SHIELD | Admitting: Gynecology

## 2015-04-24 ENCOUNTER — Ambulatory Visit (INDEPENDENT_AMBULATORY_CARE_PROVIDER_SITE_OTHER): Payer: BLUE CROSS/BLUE SHIELD

## 2015-04-24 ENCOUNTER — Other Ambulatory Visit: Payer: Self-pay | Admitting: Gynecology

## 2015-04-24 VITALS — BP 118/76

## 2015-04-24 DIAGNOSIS — D251 Intramural leiomyoma of uterus: Secondary | ICD-10-CM

## 2015-04-24 DIAGNOSIS — R188 Other ascites: Secondary | ICD-10-CM

## 2015-04-24 DIAGNOSIS — N8320 Unspecified ovarian cysts: Secondary | ICD-10-CM

## 2015-04-24 DIAGNOSIS — N83201 Unspecified ovarian cyst, right side: Secondary | ICD-10-CM

## 2015-04-24 DIAGNOSIS — N83202 Unspecified ovarian cyst, left side: Principal | ICD-10-CM

## 2015-04-24 DIAGNOSIS — N898 Other specified noninflammatory disorders of vagina: Secondary | ICD-10-CM | POA: Diagnosis not present

## 2015-04-24 LAB — WET PREP FOR TRICH, YEAST, CLUE
Clue Cells Wet Prep HPF POC: NONE SEEN
Trich, Wet Prep: NONE SEEN

## 2015-04-24 MED ORDER — FLUCONAZOLE 150 MG PO TABS: 150.0000 mg | ORAL_TABLET | Freq: Once | ORAL | Status: DC

## 2015-04-24 MED ORDER — NORETHIN ACE-ETH ESTRAD-FE 1-20 MG-MCG PO TABS: 1.0000 | ORAL_TABLET | Freq: Every day | ORAL | Status: DC

## 2015-04-24 NOTE — Patient Instructions (Signed)
Fluconazole tablets Qu es este medicamento? El FLUCONAZOL es un medicamento antimictico. Se utiliza para tratar ciertos tipos de infecciones micticas o por levadura. Este medicamento puede ser utilizado para otros usos; si tiene alguna pregunta consulte con su proveedor de atencin mdica o con su farmacutico. MARCAS COMERCIALES DISPONIBLES: Diflucan Qu le debo informar a mi profesional de la salud antes de tomar este medicamento? Necesita saber si usted presenta alguno de los siguientes problemas o situaciones: -antecedentes de pulso cardaco irregular -enfermedad renal -una reaccin alrgica o inusual al fluconazol, a otros azoles u otros medicamentos, alimentos, colorantes o conservantes -si est embarazada o buscando quedar embarazada -si est amamantando a un beb Cmo debo utilizar este medicamento? Tome este medicamento por va oral. Siga las instrucciones de la etiqueta del [place]. No tome su medicamento con una frecuencia mayor a la indicada. Hable con su pediatra para informarse acerca del uso de este medicamento en nios. Puede requerir atencin especial. Este medicamento ha sido recetado a nios tan menores como de 6 meses de [place]. Sobredosis: Pngase en contacto inmediatamente con un centro toxicolgico o una sala de urgencia si usted cree que haya tomado demasiado medicamento. ATENCIN: [employer] es solo para usted. No comparta este medicamento con nadie. Qu sucede si me olvido de una dosis? Si olvida una dosis, tmela lo antes posible. Si es casi la hora de la prxima dosis, tome slo esa dosis. No tome dosis adicionales o dobles. Qu puede interactuar con este medicamento? No tome esta medicina con ninguno de los siguientes medicamentos: -astemizol -ciertos medicamentos para el pulso cardiaco irregular, tales como dofetilida, dronedarona, quinidina -cisapride -eritromicina -lomitapida -otros medicamentos que prolongan el intervalo QT (causa un ritmo  cardiaco anormal) -pimozida -terfenadina -tioridazina -tolvaptn -ziprasidona Esta medicina tambin puede interactuar con los siguientes medicamentos: -medicamentos antivirales para el VIH o SIDA -pldoras anticonceptivas -ciertos antibiticos, tales como rifabutina, rifampicina -ciertos medicamentos para la presin sangunea, tales como amlodipina, isradipina, felodipino, hidroclorotiazida, losartn, nifedipina -ciertos medicamentos para el cncer, tales como ciclofosfamida, vinblastina, vincristina -ciertos medicamentos para el colesterol, tales como atorvastatina, lovastatina, fluvastatina, simvastatina -ciertos medicamentos para la depresin, ansiedad o trastornos psicticos, tales como amitriptilina, midazolam, nortriptilina, triazolam -ciertos medicamentos para la diabetes, tales como glipizida, gliburida, tolbutamida -ciertos medicamentos para Conservation officer, historic buildings, tales como alfentanilo, fentanilo, metadona -ciertos medicamentos para convulsiones, tales como carbamazepina, fenitona -ciertos medicamentos que tratan o previenen cogulos sanguneos, tales como warfarina -halofantrina -medicamentos que reducen su capacidad de combatir infecciones, tales como ciclosporina, prednisona, tacrolimo -los AINE, medicamentos para el dolor o inflamacin, tales como celecoxib, diclofenaco, flurbiprofeno, ibuprofeno, meloxicam, naproxeno -otros medicamentos para infecciones micticas -sirolims -teofilina -tofacitinib Puede ser que esta lista no menciona todas las posibles interacciones. Informe a su profesional de [employer]	[place] de [employer] productos a base de hierbas, medicamentos de [place] o suplementos nutritivos que est tomando. Si usted fuma, consume bebidas alcohlicas o si utiliza drogas ilegales, indqueselo tambin a su profesional de [employer]	[place]. Algunas sustancias pueden interactuar con su medicamento. A qu debo estar atento al usar [employer]? Visite a su mdico o a su profesional de la  salud para chequear su evolucin peridicamente. Si toma este medicamento durante un perodo de [employer], podr Customer service manager anlisis de [place]. Si los sntomas no mejoran, consulte a su mdico. Pueden transcurrir [employer] o meses de tratamiento antes de que algunas infecciones micticas se curen. El alcohol puede aumentar la posibilidad de daos al hgado. Evite consumir bebidas alcohlicas. Si tiene una infeccin vaginal, no tenga relaciones  sexuales hasta que haya completado el tratamiento. Puede usar una toallita higinica. No use tampones. Use ropa interior de algodn no sintticos, y recin lavadas. Qu efectos secundarios puedo tener al [employer] este medicamento? Efectos secundarios que debe informar a su mdico o a Barrister's clerk de la salud tan pronto como sea posible: -Chief of Staff como erupcin cutnea, picazn o urticarias, hinchazn de los labios, boca, lengua o garganta -orina de color amarillo oscuro -sensacin de mareos o desmayos -pulso cardiaco irregular o dolor en el pecho -enrojecimiento, formacin de ampollas, descamacin o distensin de la piel, inclusive dentro de la boca -dificultad al respirar -sangrado o magulladuras inusuales -vmito -color amarillento de los ojos o la piel Efectos secundarios que, por lo general, no requieren Geophysical data processor (debe informarlos a su mdico o a Barrister's clerk de la salud si persisten o si son molestos): -cambios en el sabor de los alimentos -diarrea -dolor de cabeza -Higher education careers adviser o nuseas Puede ser que esta lista no menciona todos los posibles efectos secundarios. Comunquese a su mdico por asesoramiento mdico [employer]. Usted puede informar los efectos secundarios a la FDA por telfono al 1-800-FDA-1088. Dnde debo guardar mi medicina? Mantngala fuera del alcance de los nios. [place] a [employer], a menos de 30 grados C (73 grados F). Deseche todo el medicamento  que no haya utilizado, despus de la fecha de vencimiento. ATENCIN: Este folleto es un resumen. Puede ser que no cubra toda la posible informacin. Si usted tiene preguntas acerca de esta medicina, consulte con su mdico, su farmacutico o su profesional de Technical sales engineer.  2015, Elsevier/Gold Standard. (2013-06-08 14:13:08)

## 2015-04-24 NOTE — Progress Notes (Signed)
   Patient is a 34 year old who presented to the office today for follow-up ultrasound as a result of patient's history of ovarian cyst. Patient also had history of dysfunction uterine bleeding in the past history as follows:   Uterus measures 6.3 x 7.8 x 4.9 cm with endometrial stripe of 4.2 mm. A single right fundal fibroid measured 42 x 48 x 48 mm slightly increased last year was noted. Left  was normal and right ovarian echo free thin-walled avascular cyst was noted measuring 40 x 34 x 25 mm with a single thick septum. The cervix was then cleansed and benign solution and a sterile catheter was introduced into the uterine cavity. Normal saline was instilled into the uterine cavity and no intracavitary defects were noted.  She reports no irregular bleeding now and is on a 20 g oral contraceptive pill it was believed irregular bleeding may be attributed to the right ovarian cyst and she was instructed to return to the office for follow-up ultrasound 3 months after the last visit and she has returned today. Patient today was complain some vulvar pruritus as well. Patient in a monogamous relationship.  Ultrasound today demonstrated the following: Uterus measured 8.0 x 8.0 x 3.8 cm endometrial stripe 6.2 mm (last menstrual period March 20 13,016). Patient with a right solid fundal fibroid measuring 4.9 x 4.3 x 4.6 cm which has remained the same size since last year. A thin-walled cystic reticular echo cyst measuring 3.3 x 2.7 x 3.3 cm with positive color flow in the periphery was noted slightly decreased from last year. Arterial blood flow was noted. Left ovary there was an echo-free cyst measuring 2.2 x 2.3 cm along with a thick walled cystic solid mass measuring 4.2 x 2.8 x 3.6 cm average size 3.6 cm which was not present last year. Positive color flow the periphery was noted. Blood flow the ovary was evident. No fluid in the cul-de-sac. Her CA 125 was 37.  Patient's only complaint is vaginal itching and  white discharge. She has not been sexually active in over year.  Exam: Bartholin urethra Skene was within normal limits Vagina: White discharge was noted Cervix: No lesions or discharge Bimanual exam and rectal exam not done  Wet prep many hyphae/yeast  Ultrasound: Uterus measures 7.9 x 7.2 x 4.3 cm with endometrial stripe of 2.5 mm. Patient with an intramural fibroid measuring 3.7 x 4.4 x 3.9 cm right ovary was normal. Left ovary previous cyst not seen some fluid was noted in the area and adjacent to the ovary.  Assessment/plan: Patient with history of ovarian cysts resolve with Depo-Provera injection. 3 months ago. Patient is waiting to start her menses after she wears off the Effexor and Depo-Provera to start back on oral contraception pill. We are going to follow-up with a CA 125 since on last office visit it was slightly above normal at 37. Patient otherwise scheduled to return back to the office again of the year for her annual exam. For her yeast vaginitis she'll be prescribed Diflucan 150 mg one by mouth today.

## 2015-04-25 LAB — CA 125: CA 125: 17 U/mL (ref <35)

## 2015-09-18 ENCOUNTER — Encounter: Payer: BLUE CROSS/BLUE SHIELD | Admitting: Gynecology

## 2015-10-07 ENCOUNTER — Ambulatory Visit (INDEPENDENT_AMBULATORY_CARE_PROVIDER_SITE_OTHER): Payer: BLUE CROSS/BLUE SHIELD | Admitting: Gynecology

## 2015-10-07 ENCOUNTER — Other Ambulatory Visit (HOSPITAL_COMMUNITY)
Admission: RE | Admit: 2015-10-07 | Discharge: 2015-10-07 | Disposition: A | Discharge status: Home / Self Care | Payer: BLUE CROSS/BLUE SHIELD | Source: Ambulatory Visit | Attending: Gynecology | Admitting: Gynecology

## 2015-10-07 ENCOUNTER — Encounter: Payer: Self-pay | Admitting: Gynecology

## 2015-10-07 VITALS — BP 124/78 | Ht 58.75 in | Wt 116.0 lb

## 2015-10-07 DIAGNOSIS — Z01419 Encounter for gynecological examination (general) (routine) without abnormal findings: Secondary | ICD-10-CM | POA: Insufficient documentation

## 2015-10-07 DIAGNOSIS — Z1151 Encounter for screening for human papillomavirus (HPV): Secondary | ICD-10-CM | POA: Diagnosis not present

## 2015-10-07 DIAGNOSIS — Z87410 Personal history of cervical dysplasia: Secondary | ICD-10-CM | POA: Diagnosis not present

## 2015-10-07 LAB — CBC WITH DIFFERENTIAL/PLATELET
BASOS ABS: 0 10*3/uL (ref 0.0–0.1)
Basophils Relative: 0 % (ref 0–1)
Eosinophils Absolute: 0.1 10*3/uL (ref 0.0–0.7)
Eosinophils Relative: 1 % (ref 0–5)
HCT: 41.4 % (ref 36.0–46.0)
HEMOGLOBIN: 14.1 g/dL (ref 12.0–15.0)
LYMPHS ABS: 1.6 10*3/uL (ref 0.7–4.0)
Lymphocytes Relative: 29 % (ref 12–46)
MCH: 27.5 pg (ref 26.0–34.0)
MCHC: 34.1 g/dL (ref 30.0–36.0)
MCV: 80.7 fL (ref 78.0–100.0)
MONOS PCT: 8 % (ref 3–12)
MPV: 10.4 fL (ref 8.6–12.4)
Monocytes Absolute: 0.4 10*3/uL (ref 0.1–1.0)
NEUTROS ABS: 3.4 10*3/uL (ref 1.7–7.7)
NEUTROS PCT: 62 % (ref 43–77)
Platelets: 277 10*3/uL (ref 150–400)
RBC: 5.13 MIL/uL — ABNORMAL HIGH (ref 3.87–5.11)
RDW: 13.8 % (ref 11.5–15.5)
WBC: 5.5 10*3/uL (ref 4.0–10.5)

## 2015-10-07 LAB — COMPREHENSIVE METABOLIC PANEL
ALT: 16 U/L (ref 6–29)
AST: 18 U/L (ref 10–30)
Albumin: 4.5 g/dL (ref 3.6–5.1)
Alkaline Phosphatase: 48 U/L (ref 33–115)
BUN: 11 mg/dL (ref 7–25)
CALCIUM: 9.3 mg/dL (ref 8.6–10.2)
CO2: 27 mmol/L (ref 20–31)
Chloride: 104 mmol/L (ref 98–110)
Creat: 0.67 mg/dL (ref 0.50–1.10)
GLUCOSE: 74 mg/dL (ref 65–99)
POTASSIUM: 3.3 mmol/L — AB (ref 3.5–5.3)
Sodium: 138 mmol/L (ref 135–146)
Total Bilirubin: 0.3 mg/dL (ref 0.2–1.2)
Total Protein: 7.2 g/dL (ref 6.1–8.1)

## 2015-10-07 LAB — CHOLESTEROL, TOTAL: Cholesterol: 144 mg/dL (ref 125–200)

## 2015-10-07 NOTE — Progress Notes (Signed)
Taffie Wernimont March 24, 1981 [id number]   History:    35 y.o.  for annual gyn exam with no complaints today. She did state that early part of December her menstrual cycle started and and started again towards the end of the month. Review of her record indicated she has been treated with Depo-Provera injection back in April 7 result of bilateral ovarian cyst which had been persistent for over a year but patient had refused surgery. She had an ultrasound July 2016 which demonstrated complete resolution of ovarian cysts then a stable intramural fibroid was present. Patient also had had CA 125 in January 2016 slightly elevated with a value of 37 and on repeat at time of her last ultrasound 04/24/2015 had return back to normal range of 17. 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 Subsequent Pap smears were normal.last Pap smear 2015.  Past medical history,surgical history, family history and social history were all reviewed and documented in the EPIC chart.  Gynecologic History Patient's last menstrual period was 10/24/2014. Contraception: none Last Pap: 2015. Results were: normal Last mammogram: Not indicated. Results were: Not indicated  Obstetric History OB History  Gravida Para Term Preterm AB SAB TAB Ectopic Multiple Living  1 1 1       1     # Outcome Date GA Lbr Len/2nd Weight Sex Delivery Anes PTL Lv  1 Term     M Vag-Spont  N Y       ROS: A ROS was performed and pertinent positives and negatives are included in the history.  GENERAL: No fevers or chills. HEENT: No change in vision, no earache, sore throat or sinus congestion. NECK: No pain or stiffness. CARDIOVASCULAR: No chest pain or pressure. No palpitations. PULMONARY: No shortness of breath, cough or wheeze. GASTROINTESTINAL: No abdominal pain, nausea, vomiting or diarrhea, melena or bright red blood per rectum. GENITOURINARY: No urinary frequency, urgency,  hesitancy or dysuria. MUSCULOSKELETAL: No joint or muscle pain, no back pain, no recent trauma. DERMATOLOGIC: No rash, no itching, no lesions. ENDOCRINE: No polyuria, polydipsia, no heat or cold intolerance. No recent change in weight. HEMATOLOGICAL: No anemia or easy bruising or bleeding. NEUROLOGIC: No headache, seizures, numbness, tingling or weakness. PSYCHIATRIC: No depression, no loss of interest in normal activity or change in sleep pattern.     Exam: chaperone present  BP 124/78 mmHg  Ht 4' 10.75" (1.492 m)  Wt 116 lb (52.617 kg)  BMI 23.64 kg/m2  LMP 10/24/2014  Body mass index is 23.64 kg/(m^2).  General appearance : Well developed well nourished female. No acute distress HEENT: Eyes: no retinal hemorrhage or exudates,  Neck supple, trachea midline, no carotid bruits, no thyroidmegaly Lungs: Clear to auscultation, no rhonchi or wheezes, or rib retractions  Heart: Regular rate and rhythm, no murmurs or gallops Breast:Examined in sitting and supine position were symmetrical in appearance, no palpable masses or tenderness,  no skin retraction, no nipple inversion, no nipple discharge, no skin discoloration, no axillary or supraclavicular lymphadenopathy Abdomen: no palpable masses or tenderness, no rebound or guarding Extremities: no edema or skin discoloration or tenderness  Pelvic:  Bartholin, Urethra, Skene Glands: Within normal limits             Vagina: No gross lesions or discharge  Cervix: No gross lesions or discharge  Uterus  anteverted, normal size, shape and consistency, non-tender and mobile  Adnexa  Without masses or tenderness  Anus and  perineum  normal   Rectovaginal  normal sphincter tone without palpated masses or tenderness             Hemoccult not indicated     Assessment/Plan:  35 y.o. female for annual exam with past history of cervical dysplasia Pap smear with HPV screening done today. The following screening blood work was ordered today: Comprehensive  metabolic panel, screening cholesterol, CBC, and urinalysis. Patient declined flu vaccine. Patient to maintain menstrual calendar for the next 6 months. Patient otherwise to return back in one year or when necessary.   Terrance Mass MD, 4:45 PM 10/07/2015

## 2015-10-07 NOTE — Addendum Note (Signed)
Addended by: Thurnell Garbe A on: 10/07/2015 04:51 PM   Modules accepted: Orders, SmartSet

## 2015-10-08 LAB — URINALYSIS W MICROSCOPIC + REFLEX CULTURE
Bacteria, UA: NONE SEEN [HPF]
Bilirubin Urine: NEGATIVE
CRYSTALS: NONE SEEN [HPF]
Casts: NONE SEEN [LPF]
Glucose, UA: NEGATIVE
Hgb urine dipstick: NEGATIVE
Ketones, ur: NEGATIVE
Leukocytes, UA: NEGATIVE
NITRITE: NEGATIVE
PH: 5.5 (ref 5.0–8.0)
Protein, ur: NEGATIVE
RBC / HPF: NONE SEEN RBC/HPF (ref <=2)
SPECIFIC GRAVITY, URINE: 1.008 (ref 1.001–1.035)
Squamous Epithelial / LPF: NONE SEEN [HPF] (ref <=5)
WBC, UA: NONE SEEN WBC/HPF (ref <=5)
YEAST: NONE SEEN [HPF]

## 2015-10-09 LAB — CYTOLOGY - PAP

## 2015-10-10 ENCOUNTER — Other Ambulatory Visit: Payer: Self-pay | Admitting: Gynecology

## 2015-10-10 MED ORDER — FLUCONAZOLE 150 MG PO TABS: 150.0000 mg | ORAL_TABLET | Freq: Once | ORAL | Status: DC

## 2016-12-17 ENCOUNTER — Encounter: Payer: Self-pay | Admitting: Gynecology

## 2016-12-17 ENCOUNTER — Other Ambulatory Visit: Payer: Self-pay | Admitting: Gynecology

## 2016-12-17 ENCOUNTER — Ambulatory Visit (INDEPENDENT_AMBULATORY_CARE_PROVIDER_SITE_OTHER): Payer: BLUE CROSS/BLUE SHIELD | Admitting: Gynecology

## 2016-12-17 VITALS — BP 116/70 | Ht 59.0 in | Wt 122.0 lb

## 2016-12-17 DIAGNOSIS — Z01411 Encounter for gynecological examination (general) (routine) with abnormal findings: Secondary | ICD-10-CM | POA: Diagnosis not present

## 2016-12-17 DIAGNOSIS — Z30013 Encounter for initial prescription of injectable contraceptive: Secondary | ICD-10-CM

## 2016-12-17 DIAGNOSIS — Z87410 Personal history of cervical dysplasia: Secondary | ICD-10-CM | POA: Diagnosis not present

## 2016-12-17 DIAGNOSIS — L700 Acne vulgaris: Secondary | ICD-10-CM | POA: Diagnosis not present

## 2016-12-17 LAB — CBC WITH DIFFERENTIAL/PLATELET
Basophils Absolute: 33 cells/uL (ref 0–200)
Basophils Relative: 1 %
Eosinophils Absolute: 33 cells/uL (ref 15–500)
Eosinophils Relative: 1 %
HCT: 40.4 % (ref 35.0–45.0)
HEMOGLOBIN: 13.3 g/dL (ref 11.7–15.5)
LYMPHS ABS: 1089 {cells}/uL (ref 850–3900)
Lymphocytes Relative: 33 %
MCH: 27.4 pg (ref 27.0–33.0)
MCHC: 32.9 g/dL (ref 32.0–36.0)
MCV: 83.1 fL (ref 80.0–100.0)
MPV: 10.6 fL (ref 7.5–12.5)
Monocytes Absolute: 198 cells/uL — ABNORMAL LOW (ref 200–950)
Monocytes Relative: 6 %
NEUTROS ABS: 1947 {cells}/uL (ref 1500–7800)
Neutrophils Relative %: 59 %
PLATELETS: 270 10*3/uL (ref 140–400)
RBC: 4.86 MIL/uL (ref 3.80–5.10)
RDW: 14.5 % (ref 11.0–15.0)
WBC: 3.3 10*3/uL — AB (ref 3.8–10.8)

## 2016-12-17 LAB — COMPREHENSIVE METABOLIC PANEL
ALBUMIN: 4.3 g/dL (ref 3.6–5.1)
ALT: 15 U/L (ref 6–29)
AST: 19 U/L (ref 10–30)
Alkaline Phosphatase: 43 U/L (ref 33–115)
BILIRUBIN TOTAL: 0.5 mg/dL (ref 0.2–1.2)
BUN: 11 mg/dL (ref 7–25)
CO2: 23 mmol/L (ref 20–31)
CREATININE: 0.67 mg/dL (ref 0.50–1.10)
Calcium: 8.9 mg/dL (ref 8.6–10.2)
Chloride: 105 mmol/L (ref 98–110)
Glucose, Bld: 76 mg/dL (ref 65–99)
Potassium: 3.7 mmol/L (ref 3.5–5.3)
SODIUM: 140 mmol/L (ref 135–146)
TOTAL PROTEIN: 6.8 g/dL (ref 6.1–8.1)

## 2016-12-17 LAB — LIPID PANEL
CHOLESTEROL: 129 mg/dL (ref <200)
HDL: 68 mg/dL (ref >50)
LDL CALC: 49 mg/dL (ref <100)
TRIGLYCERIDES: 61 mg/dL (ref <150)
Total CHOL/HDL Ratio: 1.9 Ratio (ref <5.0)
VLDL: 12 mg/dL (ref <30)

## 2016-12-17 LAB — TSH: TSH: 1.28 mIU/L

## 2016-12-17 MED ORDER — MEDROXYPROGESTERONE ACETATE 150 MG/ML IM SUSP
150.0000 mg | Freq: Once | INTRAMUSCULAR | Status: AC
  Administered 2016-12-17: 150 mg via INTRAMUSCULAR

## 2016-12-17 MED ORDER — CLINDAMYCIN PHOSPHATE 1 % EX LOTN: TOPICAL_LOTION | Freq: Two times a day (BID) | CUTANEOUS | 1 refills | Status: DC

## 2016-12-17 NOTE — Progress Notes (Signed)
Alisha Morris December 14, 1980 [id number]   History:    36 y.o.  for annual gyn exam with the only complaint being of acne. She had been on the oral contraceptive pills in the past but discontinued. She would like to get pregnant the near future but would like to receive the Depo-Provera injection today and perhaps in 3 months restart the oral contraceptive pill or plan on getting pregnant.Review of her record indicated she has been treated with Depo-Provera injection back in April 7 result of bilateral ovarian cyst which had been persistent for over a year but patient had refused surgery. She had an ultrasound July 2016 which demonstrated complete resolution of ovarian cysts then a stable intramural fibroid was present. 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 Subsequent Pap smears were normal.last Pap smear 2015 in 2017.  Past medical history,surgical history, family history and social history were all reviewed and documented in the EPIC chart.  Gynecologic History Patient's last menstrual period was 12/12/2016. Contraception: none Last Pap: See above. Results were: See above Last mammogram: Not indicated. Results were: Not indicated  Obstetric History OB History  Gravida Para Term Preterm AB Living  1 1 1     1   SAB TAB Ectopic Multiple Live Births          1    # Outcome Date GA Lbr Len/2nd Weight Sex Delivery Anes PTL Lv  1 Term     M Vag-Spont  N LIV       ROS: A ROS was performed and pertinent positives and negatives are included in the history.  GENERAL: No fevers or chills. HEENT: No change in vision, no earache, sore throat or sinus congestion. NECK: No pain or stiffness. CARDIOVASCULAR: No chest pain or pressure. No palpitations. PULMONARY: No shortness of breath, cough or  wheeze. GASTROINTESTINAL: No abdominal pain, nausea, vomiting or diarrhea, melena or bright red blood per rectum. GENITOURINARY: No urinary frequency, urgency, hesitancy or dysuria. MUSCULOSKELETAL: No joint or muscle pain, no back pain, no recent trauma. DERMATOLOGIC: No rash, no itching, no lesions. ENDOCRINE: No polyuria, polydipsia, no heat or cold intolerance. No recent change in weight. HEMATOLOGICAL: No anemia or easy bruising or bleeding. NEUROLOGIC: No headache, seizures, numbness, tingling or weakness. PSYCHIATRIC: No depression, no loss of interest in normal activity or change in sleep pattern.     Exam: chaperone present  BP 116/70   Ht 4\' 11"  (1.499 m)   Wt 122 lb (55.3 kg)   LMP 12/12/2016   BMI 24.64 kg/m   Body mass index is 24.64 kg/m.  General appearance : Well developed well nourished female. No acute distress HEENT: Eyes: no retinal hemorrhage or exudates,  Neck supple, trachea midline, no carotid bruits, no thyroidmegaly Lungs: Clear to auscultation, no rhonchi or wheezes, or rib retractions  Heart: Regular rate and rhythm, no murmurs or gallops Breast:Examined in sitting and supine position were symmetrical in appearance, no palpable masses or tenderness,  no skin retraction, no nipple inversion, no nipple discharge, no skin discoloration, no axillary or supraclavicular lymphadenopathy Abdomen: no palpable masses or tenderness, no rebound or guarding Extremities: no edema or skin discoloration or tenderness  Pelvic:  Bartholin, Urethra, Skene Glands: Within normal limits  Vagina: No gross lesions or discharge  Cervix: No gross lesions or discharge  Uterus  anteverted, normal size, shape and consistency, non-tender and mobile  Adnexa  Without masses or tenderness  Anus and perineum  normal   Rectovaginal  normal sphincter tone without palpated masses or tenderness             Hemoccult not indicated     Assessment/Plan:  36 y.o. female for annual  exam currently on the tail end of her menstrual cycle requesting Depo-Provera injection which was administered today. Patient will call the office in 3 months if she was a refill on her oral contraceptive pill or return at that time for her next injection of Depo-Provera if she is not planning on getting pregnant just yet. She declined the flu vaccine today. Pap smear not done today according to the new guidelines. Because of her acne her TSH and total testosterone was drawn today along with her CBC, conference metabolic panel, lipid profile.   Terrance Mass MD, 10:29 AM 12/17/2016

## 2016-12-17 NOTE — Addendum Note (Signed)
Addended by: Nelva Nay on: 12/17/2016 10:55 AM   Modules accepted: Orders

## 2016-12-17 NOTE — Patient Instructions (Addendum)
Informacin sobre los anticonceptivos hormonales (Hormonal Contraception Information) El estrgeno y la progesterona (progestina) son hormonas usadas en muchas formas de control de natalidad (contracepcin). Estas 2 hormonas se encuentran en la mayora de los anticonceptivos hormonales. Los anticonceptivos hormonales utilizan:   Una combinacin de las hormonas estrgeno y Immunologist en una de estas formas:  Pldoras: vienen en varias combinaciones de pldoras con hormona activa y pldoras no hormonales. Las [employer] combinaciones pueden provocar que tenga el perodo una vez por mes, una vez cada 3 meses o que no tenga su perodo. Es importante que las tome a la misma hora todos [place].  El parche: se coloca en la parte inferior del abdomen todas las semanas durante 3 semanas. No se coloca en la cuarta semana.  Anillo vaginal: se coloca en la vagina y se deja all durante 3 semanas. Luego se retira por una semana.  Progesterona sola en forma de:  Pldoras: las pldoras hormonales se toman todos los das del [place].  El dispositivo intrauterino (DIU): se inserta durante un perodo menstrual y se retira o se reemplaza cada 5 aos o menos.  Implante: Se colocan unas varillas plsticas debajo de la piel, en la zona superior del brazo. Se retiran o se reemplazan cada 3 aos o menos.  Inyeccin: se aplica una vez cada [address] Puede producirse un embarazo con cualquiera de estos mtodos anticonceptivos hormonales. Si sospecha que est embarazada, hgase un test de embarazo y converse con su mdico.  ANTICONCEPTIVOS DE ESTRGENO Y PROGESTERONA Los anticonceptivos de estrgeno y progesterona pueden evitar un embarazo:  Impidiendo la liberacin de un vulo (ovulacin).  Espesando el moco cervical lo que dificulta la entrada de los espermatozoides al tero.  Modificando el revestimiento interno del tero. Esta modificacin hace que sea ms difcil que se implante el huevo. Los efectos  secundarios de los estrgenos son ms frecuentes en los primeros 2 o 3 meses. Hable con su mdico [employer] secundarios que pueden afectarla. Si usted tiene [employer] o son graves, hable con su mdico. ANTICONCEPTIVOS DE PROGESTERONA Los anticonceptivos que solo contienen progesterona pueden evitar el embarazo a travs de:   El bloqueo de la ovulacin. Esto se produce en muchas mujeres, pero algunas continuarn ovulando.   Impidiendo la entrada de los espermatozoides en el tero al mantener el moco cervical espeso y pegajoso.   Modificando el revestimiento interno del tero. Este cambio hace que sea ms difcil que se implante el huevo.  Los efectos secundarios de la progesterona pueden variar. Hable con su mdico [employer] secundarios que pueden afectarla. Si usted tiene [employer] o son graves, hable con su mdico.  Esta informacin no tiene Marine scientist el consejo del mdico. Asegrese de hacerle al mdico cualquier pregunta que tenga. Document Released: 06/08/2012 Document Revised: 05/17/2013 Elsevier Interactive Patient Education  2017 [place] Acne El acn es un problema de la piel que causa granos. Se manifiesta cuando los poros de la piel se obstruyen. Los poros pueden infectarse con bacterias o pueden enrojecerse, irritarse o hincharse. El acn es un problema frecuente, especialmente en los adolescentes. Esta afeccin suele desaparecer con el tiempo. Cules son las causas? Cada poro contiene una glndula sebcea. Las glndulas sebceas producen una sustancia grasa llamada sebo. El acn aparece cuando el sebo, las clulas muertas de la piel y la suciedad obstruyen estas glndulas. Luego, las bacterias que se encuentran normalmente en las glndulas sebceas proliferan y producen inflamacin.  Con frecuencia, los cambios hormonales son factores desencadenantes del acn. Estos cambios pueden estimular el  agrandamiento de las glndulas sebceas y aumentar la produccin de sebo. Algunos factores que pueden empeorar el acn son los siguientes: Cambios hormonales durante: La adolescencia. Los [employer]. Embarazo. Los cosmticos y los productos capilares a base de [place]. El frotado fuerte de la piel. Los [employer]. Estrs. Los problemas hormonales causados por ciertas enfermedades. El cabello largo o graso que roza la piel. Ciertos medicamentos. La presin que [place] protectores de hombros. Exposicin a ciertos aceites y sustancias qumicas. Qu incrementa el riesgo? Es ms probable que Orthoptist en: [employer]. Las personas con antecedentes familiares de acn. Cules son los signos o los sntomas? Es ms frecuente que el acn aparezca en el rostro, el cuello, el pecho y la parte superior de la espalda. Algunos sntomas son los siguientes: Pequeas protuberancias de color rojo (granos o ppulas). Espinillas blancas. Espinillas negras. Granos pequeos llenos de pus (pstulas). Granos o pstulas grandes de color rojo que causan dolor a la palpacin. Los casos ms graves de acn pueden producir lo siguiente: Una zona infectada que contiene una acumulacin de pus (absceso). Sacos duros, dolorosos y llenos de lquido (quistes). Cicatrices. Cmo se diagnostica? Esta afeccin se diagnostica mediante sus antecedentes mdicos y un examen fsico. Es posible que le indiquen anlisis de [place]. Cmo se trata? El tratamiento de esta afeccin puede variar en funcin de la gravedad del acn. El tratamiento puede incluir lo siguiente: Proofreader y lociones que evitan la obstruccin de las glndulas sebceas. Cremas y lociones que tratan o [place] infecciones y la inflamacin. Antibiticos que se aplican en la piel o se toman como comprimidos. Comprimidos que reducen la produccin de sebo. Pldoras  anticonceptivas. Fototerapia o tratamientos con lser. Someterse a [place]. Inyecciones de medicamentos en las zonas afectadas. Sustancias qumicas que descaman la piel. El mdico tambin le recomendar la mejor forma de cuidarse la piel. El cuidado adecuado de la piel es el aspecto ms importante del [place]. Siga estas instrucciones en su casa: Cuidado de la piel  Cudese la piel como se lo haya indicado el mdico. Tal vez le indiquen que haga lo siguiente: Lvese la piel con suavidad al menos dos veces por da y adems: Despus de hacer ejercicios. Antes de acostarse. Use un jabn suave. Aplquese un humectante a base de agua para la piel despus del lavado. Use una pantalla o un protector solar con factor de proteccin (FPS) de30 o ms. Esto es muy importante si [place] medicamentos para Psychologist, prison and probation services. Elija cosmticos que no le obstruyan las glndulas sebceas no comedognicos). Medicamentos  [employer] de venta libre y los recetados solamente como se lo haya indicado el mdico. Si le recetaron un antibitico, aplqueselo o tmelo como se lo haya indicado el mdico. No deje de tomar el antibitico aunque la afeccin mejore. Instrucciones generales  Mantenga el cabello limpio y fuera del rostro. Si tiene el cabello graso, lveselo con champ con frecuencia o [employer]. No apoye el mentn ni la frente [employer]. No use vinchas ni sombreros ajustados. No se escarbe ni se apriete los granos. ya que eso puede empeorar el acn y dejar cicatrices. Concurra a todas las visitas de control como se lo haya indicado el mdico. Esto es importante. Afitese con suavidad y solo cuando sea necesario. Lleve un diario de los alimentos para determinar si hay alguno que  guarda relacin con el acn. Comunquese con un mdico si: El acn no mejora despus de ocho semanas. El acn [place]. Una zona grande de la piel est enrojecida o sensible. Cree que el medicamento para  tratar el acn tiene efectos secundarios. Esta informacin no tiene Marine scientist el consejo del mdico. Asegrese de hacerle al mdico cualquier pregunta que tenga. Document Released: 09/14/2005 Document Revised: 09/03/2016 Document Reviewed: 11/21/2014 Elsevier Interactive Patient Education  2017 [employer].

## 2016-12-20 LAB — TESTOSTERONE, TOTAL, LC/MS/MS: TESTOSTERONE, TOTAL, LC-MS-MS: 22 ng/dL (ref 2–45)

## 2016-12-21 ENCOUNTER — Other Ambulatory Visit: Payer: Self-pay | Admitting: Gynecology

## 2016-12-21 ENCOUNTER — Other Ambulatory Visit: Payer: Self-pay | Admitting: Anesthesiology

## 2016-12-21 DIAGNOSIS — D72819 Decreased white blood cell count, unspecified: Secondary | ICD-10-CM

## 2016-12-21 DIAGNOSIS — D708 Other neutropenia: Secondary | ICD-10-CM

## 2016-12-23 ENCOUNTER — Other Ambulatory Visit: Payer: BLUE CROSS/BLUE SHIELD

## 2016-12-23 DIAGNOSIS — D708 Other neutropenia: Secondary | ICD-10-CM

## 2016-12-23 LAB — CBC WITH DIFFERENTIAL/PLATELET
BASOS PCT: 1 %
Basophils Absolute: 52 cells/uL (ref 0–200)
Eosinophils Absolute: 0 cells/uL — ABNORMAL LOW (ref 15–500)
Eosinophils Relative: 0 %
HEMATOCRIT: 37.9 % (ref 35.0–45.0)
Hemoglobin: 12.7 g/dL (ref 11.7–15.5)
LYMPHS PCT: 36 %
Lymphs Abs: 1872 cells/uL (ref 850–3900)
MCH: 27.3 pg (ref 27.0–33.0)
MCHC: 33.5 g/dL (ref 32.0–36.0)
MCV: 81.5 fL (ref 80.0–100.0)
MONO ABS: 260 {cells}/uL (ref 200–950)
MPV: 10.2 fL (ref 7.5–12.5)
Monocytes Relative: 5 %
Neutro Abs: 3016 cells/uL (ref 1500–7800)
Neutrophils Relative %: 58 %
Platelets: 274 10*3/uL (ref 140–400)
RBC: 4.65 MIL/uL (ref 3.80–5.10)
RDW: 14.3 % (ref 11.0–15.0)
WBC: 5.2 10*3/uL (ref 3.8–10.8)

## 2017-02-10 ENCOUNTER — Encounter: Payer: Self-pay | Admitting: Gynecology

## 2017-03-13 ENCOUNTER — Other Ambulatory Visit: Payer: Self-pay | Admitting: Gynecology

## 2017-03-15 NOTE — Telephone Encounter (Signed)
Per 12/17/16 note "Patient will call the office in 3 months if she was a refill on her oral contraceptive pill or return at that time for her next injection of Depo-Provera if she is not planning on getting pregnant just yet"

## 2017-05-07 ENCOUNTER — Ambulatory Visit: Payer: BLUE CROSS/BLUE SHIELD | Admitting: Obstetrics & Gynecology

## 2017-05-28 ENCOUNTER — Ambulatory Visit: Payer: BLUE CROSS/BLUE SHIELD | Admitting: Obstetrics & Gynecology

## 2017-06-08 ENCOUNTER — Ambulatory Visit: Payer: BLUE CROSS/BLUE SHIELD | Admitting: Obstetrics & Gynecology

## 2017-07-06 ENCOUNTER — Encounter: Payer: Self-pay | Admitting: Women's Health

## 2017-07-06 ENCOUNTER — Ambulatory Visit (INDEPENDENT_AMBULATORY_CARE_PROVIDER_SITE_OTHER): Payer: BLUE CROSS/BLUE SHIELD | Admitting: Women's Health

## 2017-07-06 VITALS — BP 124/74

## 2017-07-06 DIAGNOSIS — B373 Candidiasis of vulva and vagina: Secondary | ICD-10-CM

## 2017-07-06 DIAGNOSIS — B3731 Acute candidiasis of vulva and vagina: Secondary | ICD-10-CM

## 2017-07-06 DIAGNOSIS — N898 Other specified noninflammatory disorders of vagina: Secondary | ICD-10-CM | POA: Diagnosis not present

## 2017-07-06 LAB — WET PREP FOR TRICH, YEAST, CLUE

## 2017-07-06 MED ORDER — FLUCONAZOLE 150 MG PO TABS: 150.0000 mg | ORAL_TABLET | Freq: Once | ORAL | 1 refills | Status: AC

## 2017-07-06 MED ORDER — TERCONAZOLE 0.4 % VA CREA: TOPICAL_CREAM | VAGINAL | 1 refills | Status: DC

## 2017-07-06 NOTE — Patient Instructions (Signed)
Candidiasis vaginal en los adultos °(Gastrointestinal Yeast Infection, Adult) °La candidiasis vaginal es una afección que causa dolor, hinchazón y enrojecimiento (inflamación) de la vagina. También causa secreción vaginal. Esta es una enfermedad frecuente. Algunas mujeres contraen esta infección con frecuencia. °CAUSAS °La causa de la infección es un cambio en el equilibrio normal de los hongos (cándida) y las bacterias que viven en la vagina. Esta alteración deriva en el crecimiento excesivo de los hongos, lo que causa la inflamación. °FACTORES DE RIESGO °Es más probable que esta afección se manifieste en: °· Las mujeres que toman antibióticos. °· Las mujeres que tienen diabetes. °· Las mujeres que toman anticonceptivos. °· Las mujeres que están embarazadas. °· Las mujeres que se hacen duchas vaginales con frecuencia. °· Las mujeres que tienen un sistema de defensa (inmunitario) débil. °· Las mujeres que han tomado corticoides durante mucho tiempo. °· Las mujeres que usan ropa ajustada con frecuencia. °SÍNTOMAS °Los síntomas de esta afección incluyen lo siguiente: °· Secreción vaginal blanca y espesa. °· Hinchazón, picazón, enrojecimiento e irritación de la vagina. Los labios de la vagina (vulva) también se pueden infectar. °· Dolor o ardor al orinar. °· Dolor durante las relaciones sexuales. °DIAGNÓSTICO °Esta afección se diagnostica mediante la historia clínica y un examen físico. Este incluye un examen pélvico. El médico examinará una muestra de la secreción vaginal con un microscopio. Probablemente el médico envíe esta muestra al laboratorio para analizarla y confirmar el diagnóstico. °TRATAMIENTO °Esta afección se trata con medicamentos. Los medicamentos pueden ser recetados o de venta libre. Podrán indicarle que use uno o más de lo siguiente: °· Medicamentos por vía oral. °· Medicamentos que se aplican como una crema. °· Medicamentos que se colocan directamente en la vagina (óvulos vaginales). °INSTRUCCIONES  PARA EL CUIDADO EN EL HOGAR °· Tome o aplíquese los medicamentos de venta libre y recetados solamente como se lo haya indicado el médico. °· No tenga relaciones sexuales hasta que el médico lo autorice. Comunique a su compañero sexual que tiene una infección por hongos. Esas personas deben consultar al médico si tienen síntomas. °· No use ropa ajustada, como pantis o pantalones ajustados. °· Evite el uso de tampones hasta que el médico lo autorice. °· Consuma más yogur. Esto puede ayudar a evitar la recurrencia de la candidiasis. °· Intente darse un baño de asiento para aliviar las molestias. Se trata de un baño de agua tibia que se toma mientras se está sentado. El agua solo debe llegar hasta las caderas y cubrir las nalgas. Hágalo 3 o 4 veces al día o como se lo haya indicado el médico. °· No se haga duchas vaginales. °· Use ropa interior transpirable de algodón. °· Si tiene diabetes, mantenga bajo control los niveles de azúcar en la sangre. °SOLICITE ATENCIÓN MÉDICA SI: °· Tiene fiebre. °· Los síntomas desaparecen y luego reaparecen. °· Los síntomas no mejoran con el tratamiento. °· Los síntomas empeoran. °· Aparecen nuevos síntomas. °· Aparecen ampollas alrededor o adentro de la vagina. °· Le sale sangre de la vagina y no está menstruando. °· Siente dolor en el abdomen. °Esta información no tiene como fin reemplazar el consejo del médico. Asegúrese de hacerle al médico cualquier pregunta que tenga. °Document Released: 06/24/2005 Document Revised: 01/06/2016 Document Reviewed: 03/18/2015 °Elsevier Interactive Patient Education © 2018 Elsevier Inc. ° °

## 2017-07-06 NOTE — Progress Notes (Signed)
36 year old MHF G1 P1 Presents with complaint of external vaginal irritation with itching for the past week. Has had problem greater than 6 months, has been using over-the-counter Monistat for relief. Denies visible discharge, odor, urinary symptoms, abdominal pain or fever. No known health problems.  Monthly cycle, pregnancy okay, declines need for contraception, same partner. Interpreter present  Exam: Appears well. External genitalia erythematous, speculum exam no visible discharge, vaginal walls slightly erythematous, wet prep positive for yeast. Bimanual no CMT or adnexal tenderness.  Recurrent Yeast vaginitis  Plan: Yeast prevention discussed. Diflucan 150 mg by mouth times one dose with refill. Terazol 7 apply externally twice daily until symptoms resolve. Instructed to call if continued problems. Continue prenatal vitamins daily. Return to office with missed cycle. Aware we no longer deliver. Safe pregnancy behaviors reviewed.

## 2017-07-13 ENCOUNTER — Ambulatory Visit: Payer: BLUE CROSS/BLUE SHIELD | Admitting: Obstetrics & Gynecology

## 2017-09-09 ENCOUNTER — Ambulatory Visit: Payer: BLUE CROSS/BLUE SHIELD | Admitting: Obstetrics & Gynecology

## 2017-09-09 ENCOUNTER — Encounter: Payer: Self-pay | Admitting: Obstetrics & Gynecology

## 2017-09-09 VITALS — BP 122/70

## 2017-09-09 DIAGNOSIS — Z3169 Encounter for other general counseling and advice on procreation: Secondary | ICD-10-CM

## 2017-09-09 DIAGNOSIS — N930 Postcoital and contact bleeding: Secondary | ICD-10-CM

## 2017-09-09 DIAGNOSIS — Z113 Encounter for screening for infections with a predominantly sexual mode of transmission: Secondary | ICD-10-CM | POA: Diagnosis not present

## 2017-09-09 DIAGNOSIS — D219 Benign neoplasm of connective and other soft tissue, unspecified: Secondary | ICD-10-CM | POA: Diagnosis not present

## 2017-09-09 DIAGNOSIS — N921 Excessive and frequent menstruation with irregular cycle: Secondary | ICD-10-CM

## 2017-09-09 LAB — PREGNANCY, URINE: Preg Test, Ur: NEGATIVE

## 2017-09-09 MED ORDER — NORETHINDRONE ACETATE 5 MG PO TABS: 5.0000 mg | ORAL_TABLET | Freq: Two times a day (BID) | ORAL | 0 refills | Status: DC

## 2017-09-09 NOTE — Progress Notes (Signed)
    Alisha Morris [birth year] [id number]        36 y.o.  G1P1001 Boyfriend  RP:  Menometrorrhagia/Postcoital bleeding x 04/2017  HPI:  Stopped BCPs Junel 1/20 in August 2018 to attempt conception.  Menometrorrhagia since stopping her birth control pills.  Menses have been rather heavy with cycles varying from 2-4 weeks interval and lasting up to 14 days.  She has now bled for 14 straight days with heavy flow at times.  The bleeding is accompanied by moderate pelvic cramps.  Also complains of bleeding after intercourse.  No abnormal vaginal discharge.  No urinary tract infection symptoms.  Bowel movements normal.  Past medical history,surgical history, problem list, medications, allergies, family history and social history were all reviewed and documented in the EPIC chart.  Directed ROS with pertinent positives and negatives documented in the history of present illness/assessment and plan.  Exam:  Vitals:   09/09/17 0835  BP: 122/70   General appearance:  Normal  Gyn exam:  Vulva normal.  Speculum:  Mild dark bleeding from IO.  Cervix/Vagina normal.  Gono-Chlam done.  Bimanual exam: Uterus anteverted, nodular with probable fibroids, mobile, mildly tender.  No adnexal mass felt, nontender.   Assessment/Plan:  36 y.o. G1P1001   1. Menometrorrhagia Stopped her birth control pills to attempt conception in August 2018.  Menometrorrhagia since then.  Probable chronic anovulation/PCOS.  Per exam uterine fibroids are likely.  Pelvic ultrasound July 2016 revealed a 4.4 cm intramural fibroid.  Urine pregnancy test negative today.  Will do a home pregnancy test at 2 weeks protected intercourse and if negative will start Aygestin to control bleeding. - CBC - TSH - Prolactin - HgB A1c - Pregnancy, urine - US Transvaginal Non-OB; Future  2. Fibroid Uterine fibroids per pelvic ultrasound in 2016 and gynecologic exam today.  Will evaluate by pelvic ultrasound at next visit.  3. Postcoital  bleeding Rule out intra-uterine lesion.  Rule out STD.  Gonorrhea and Chlamydia testing done today and patient will follow up for pelvic ultrasound. -Gono-Chlam done  4. Encounter for preconception consultation 36 year old advanced maternal age with probable PCO.  Will evaluate with labs today and follow-up pelvic ultrasound.  Will decide on indication for ovulation stimulation with Clomid when all the results are available.  Recommend prenatal vitamins.  Other orders - norethindrone (AYGESTIN) 5 MG tablet; Take 1 tablet (5 mg total) by mouth 2 (two) times daily.  Counseling on above issues >50% x 25 minutes  [name] MD, 8:48 AM 09/09/2017

## 2017-09-10 ENCOUNTER — Encounter: Payer: Self-pay | Admitting: Obstetrics & Gynecology

## 2017-09-10 LAB — CBC
HCT: 37.5 % (ref 35.0–45.0)
Hemoglobin: 12.5 g/dL (ref 11.7–15.5)
MCH: 27.1 pg (ref 27.0–33.0)
MCHC: 33.3 g/dL (ref 32.0–36.0)
MCV: 81.3 fL (ref 80.0–100.0)
MPV: 11.4 fL (ref 7.5–12.5)
PLATELETS: 308 10*3/uL (ref 140–400)
RBC: 4.61 10*6/uL (ref 3.80–5.10)
RDW: 13.8 % (ref 11.0–15.0)
WBC: 3.5 10*3/uL — ABNORMAL LOW (ref 3.8–10.8)

## 2017-09-10 LAB — TSH: TSH: 1.18 m[IU]/L

## 2017-09-10 LAB — HEMOGLOBIN A1C
Hgb A1c MFr Bld: 5.1 % of total Hgb (ref <5.7)
Mean Plasma Glucose: 100 (calc)
eAG (mmol/L): 5.5 (calc)

## 2017-09-10 LAB — PROLACTIN: Prolactin: 32.2 ng/mL — ABNORMAL HIGH

## 2017-09-10 LAB — C. TRACHOMATIS/N. GONORRHOEAE RNA
C. trachomatis RNA, TMA: NOT DETECTED
N. gonorrhoeae RNA, TMA: NOT DETECTED

## 2017-09-10 NOTE — Patient Instructions (Signed)
1. Menometrorrhagia Stopped her birth control pills to attempt conception in August 2018.  Menometrorrhagia since then.  Probable chronic anovulation/PCOS.  Per exam uterine fibroids are likely.  Pelvic ultrasound July 2016 revealed a 4.4 cm intramural fibroid.  Urine pregnancy test negative today.  Will do a home pregnancy test at 2 weeks protected intercourse and if negative will start Aygestin to control bleeding. - CBC - TSH - Prolactin - HgB A1c - Pregnancy, urine - US Transvaginal Non-OB; Future  2. Fibroid Uterine fibroids per pelvic ultrasound in 2016 and gynecologic exam today.  Will evaluate by pelvic ultrasound at next visit.  3. Postcoital bleeding Rule out intra-uterine lesion.  Rule out STD.  Gonorrhea and Chlamydia testing done today and patient will follow up for pelvic ultrasound. -Gono-Chlam done  4. Encounter for preconception consultation 36 year old advanced maternal age with probable PCO.  Will evaluate with labs today and follow-up pelvic ultrasound.  Will decide on indication for ovulation stimulation with Clomid when all the results are available.  Recommend prenatal vitamins.  Other orders - norethindrone (AYGESTIN) 5 MG tablet; Take 1 tablet (5 mg total) by mouth 2 (two) times daily.  Peter Congo, fue un placer conocerla hoy!  Voy a informarla de Financial trader.

## 2017-09-23 ENCOUNTER — Ambulatory Visit: Payer: BLUE CROSS/BLUE SHIELD | Admitting: Obstetrics & Gynecology

## 2017-09-23 ENCOUNTER — Ambulatory Visit (INDEPENDENT_AMBULATORY_CARE_PROVIDER_SITE_OTHER): Payer: BLUE CROSS/BLUE SHIELD

## 2017-09-23 DIAGNOSIS — N921 Excessive and frequent menstruation with irregular cycle: Secondary | ICD-10-CM

## 2017-09-23 DIAGNOSIS — N979 Female infertility, unspecified: Secondary | ICD-10-CM | POA: Diagnosis not present

## 2017-09-25 ENCOUNTER — Encounter: Payer: Self-pay | Admitting: Obstetrics & Gynecology

## 2017-09-25 NOTE — Patient Instructions (Signed)
1. Menometrorrhagia Patient responded to Aygestin treatment.  Intramural fibroid measuring 5.1 cm on pelvic ultrasound today.  Findings discussed with patient.  2. Primary female infertility Will follow up for discussion on long-standing infertility.  Peter Congo, fue un placer verla hoy!  Desculpe que no teniamos electricidad.
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
## 2018-06-27 ENCOUNTER — Encounter: Payer: Self-pay | Admitting: Obstetrics & Gynecology

## 2018-06-27 ENCOUNTER — Ambulatory Visit: Payer: BLUE CROSS/BLUE SHIELD | Admitting: Obstetrics & Gynecology

## 2018-06-27 VITALS — BP 116/74

## 2018-06-27 DIAGNOSIS — R1031 Right lower quadrant pain: Secondary | ICD-10-CM | POA: Diagnosis not present

## 2018-06-27 DIAGNOSIS — Z113 Encounter for screening for infections with a predominantly sexual mode of transmission: Secondary | ICD-10-CM | POA: Diagnosis not present

## 2018-06-27 LAB — CBC WITH DIFFERENTIAL/PLATELET
Basophils Absolute: 38 cells/uL (ref 0–200)
Basophils Relative: 0.6 %
EOS ABS: 32 {cells}/uL (ref 15–500)
Eosinophils Relative: 0.5 %
HCT: 35.2 % (ref 35.0–45.0)
Hemoglobin: 11.6 g/dL — ABNORMAL LOW (ref 11.7–15.5)
Lymphs Abs: 1575 cells/uL (ref 850–3900)
MCH: 26.7 pg — AB (ref 27.0–33.0)
MCHC: 33 g/dL (ref 32.0–36.0)
MCV: 80.9 fL (ref 80.0–100.0)
MPV: 11.6 fL (ref 7.5–12.5)
Monocytes Relative: 8.5 %
NEUTROS PCT: 65.4 %
Neutro Abs: 4120 cells/uL (ref 1500–7800)
PLATELETS: 248 10*3/uL (ref 140–400)
RBC: 4.35 10*6/uL (ref 3.80–5.10)
RDW: 13.8 % (ref 11.0–15.0)
TOTAL LYMPHOCYTE: 25 %
WBC mixed population: 536 cells/uL (ref 200–950)
WBC: 6.3 10*3/uL (ref 3.8–10.8)

## 2018-06-27 NOTE — Addendum Note (Signed)
Addended by: Thurnell Garbe A on: 06/27/2018 05:08 PM   Modules accepted: Orders

## 2018-06-27 NOTE — Progress Notes (Signed)
    Alisha Morris 25-Jun-1981 [id number]        37 y.o.  G1P1001   RP: RLQ pain x 1 week  HPI: Had tenderness in the right lower quadrant on off for a few weeks, but for the last week increased pain that radiates to the back.  No vaginal discharge.  No fever.  Last menstrual period normal on June 13, 2018.  No abnormal bleeding.  No contraception, would like to conceive.  Urine and bowel movements normal.   OB History  Gravida Para Term Preterm AB Living  1 1 1     1   SAB TAB Ectopic Multiple Live Births          1    # Outcome Date GA Lbr Len/2nd Weight Sex Delivery Anes PTL Lv  1 Term     M Vag-Spont  N LIV    Past medical history,surgical history, problem list, medications, allergies, family history and social history were all reviewed and documented in the EPIC chart.   Directed ROS with pertinent positives and negatives documented in the history of present illness/assessment and plan.  Exam:  Vitals:   06/27/18 1510  BP: 116/74   General appearance:  Normal  Abdomen: Normal, soft, non-distended  Gynecologic exam: Vulva normal.  Speculum:  Cervix normal, vagina normal.  Normal secretions.  Gono-Chlam done.  Bimanual exam:  Cervix tender to mobilization.  Uterus AV, nodular with fibroid, mobile.  Left adnexa normal.  Right adnexa probable cyst, mildly tender.   Assessment/Plan:  37 y.o. G1P1001   1. RLQ abdominal pain Probable Rt Ovarian cyst.  R/O Gono-Chlam/PID.  Pending CBC/diff.  F/U tomorrow for Pelvic US.  Will decide on treatment per findings tomorrow.  Ibuprofen 800 mg every 8 hours until f/u. - CBC w/Diff - US Transvaginal Non-OB; Future - Gono-Chlam  Counseling on above issues and coordination of care more than 50% for 25 minutes.  [name] MD, 3:19 PM 06/27/2018

## 2018-06-27 NOTE — Patient Instructions (Signed)
1. RLQ abdominal pain Probable Rt Ovarian cyst.  R/O Gono-Chlam/PID.  Pending CBC/diff.  F/U tomorrow for Pelvic US.  Will decide on treatment per findings tomorrow.  Ibuprofen 800 mg every 8 hours until f/u. - CBC w/Diff - US Transvaginal Non-OB; Future - Burley Saver, fue un placer verle hoy!  Vamos a hacer el [employer] y [employer] de laboratorio para decidir del [place].

## 2018-06-28 ENCOUNTER — Ambulatory Visit (INDEPENDENT_AMBULATORY_CARE_PROVIDER_SITE_OTHER): Payer: BLUE CROSS/BLUE SHIELD

## 2018-06-28 ENCOUNTER — Ambulatory Visit: Payer: BLUE CROSS/BLUE SHIELD | Admitting: Obstetrics & Gynecology

## 2018-06-28 ENCOUNTER — Other Ambulatory Visit: Payer: Self-pay | Admitting: Obstetrics & Gynecology

## 2018-06-28 DIAGNOSIS — D251 Intramural leiomyoma of uterus: Secondary | ICD-10-CM

## 2018-06-28 DIAGNOSIS — N831 Corpus luteum cyst of ovary, unspecified side: Secondary | ICD-10-CM

## 2018-06-28 DIAGNOSIS — R1031 Right lower quadrant pain: Secondary | ICD-10-CM

## 2018-06-28 DIAGNOSIS — B373 Candidiasis of vulva and vagina: Secondary | ICD-10-CM | POA: Diagnosis not present

## 2018-06-28 DIAGNOSIS — B3731 Acute candidiasis of vulva and vagina: Secondary | ICD-10-CM

## 2018-06-28 LAB — C. TRACHOMATIS/N. GONORRHOEAE RNA
C. TRACHOMATIS RNA, TMA: NOT DETECTED
N. gonorrhoeae RNA, TMA: NOT DETECTED

## 2018-06-28 MED ORDER — FLUCONAZOLE 150 MG PO TABS: 150.0000 mg | ORAL_TABLET | Freq: Every day | ORAL | 2 refills | Status: AC

## 2018-06-28 NOTE — Progress Notes (Addendum)
    Alisha Morris 31-Mar-1981 [id number]        37 y.o.  G1P1001   RP: RLQ pain for Pelvic US  HPI: RLQ pain with radiation to the lower back.  Normal vaginal secretions.  No fever.   OB History  Gravida Para Term Preterm AB Living  1 1 1     1   SAB TAB Ectopic Multiple Live Births          1    # Outcome Date GA Lbr Len/2nd Weight Sex Delivery Anes PTL Lv  1 Term     M Vag-Spont  N LIV    Past medical history,surgical history, problem list, medications, allergies, family history and social history were all reviewed and documented in the EPIC chart.   Directed ROS with pertinent positives and negatives documented in the history of present illness/assessment and plan.  Exam:  There were no vitals filed for this visit. General appearance:  Normal  Pelvic US today: T/V images.  Retroverted uterus measuring 3.16 x 5.64 x 4.36 cm.  Endometrial line trial layered normal at 14.4 mm.  Intramural fibroids measuring 2.3 x 1.9 cm, 4.8 x 4.8 cm.  Left ovary normal.  Right ovary with a thick-walled cyst with positive color flow Doppler at the periphery periphery, internal low-level echoes, measuring 2.3 x 2.0 cm.  Free fluid in the right posterior cul-de-sac and around the right ovary measuring 5.6 x 1.9 x 3.9 cm.    Gono-Chlam negative  CBC WBC normal at 6.3          Hb 11.6   Assessment/Plan:  37 y.o. G1P1001   1. RLQ abdominal pain Probable right functional ovarian cyst in the process of rupture.  Patient reassured.  Precautions reviewed with patient.  Will use ibuprofen regularly 600 mg every 6 hours in the coming 2-3 days.  Recent gonorrhea and Chlamydia negative.  White blood cells normal at 6.3 and hemoglobin 11.6.  Will follow-up for reevaluation if worsening pelvic pain.  2. Vaginal yeast infection Clinical yeast vaginitis.  Will treat with fluconazole 1 tablet per mouth daily for 3 days.  Usage reviewed with patient and prescription sent to pharmacy.  Other orders -  fluconazole (DIFLUCAN) 150 MG tablet; Take 1 tablet (150 mg total) by mouth daily for 3 days.  Counseling on above issues and coordination of care more than 50% for 25 minutes.  [name] MD, 5:15 PM 06/28/2018

## 2018-07-03 ENCOUNTER — Encounter: Payer: Self-pay | Admitting: Obstetrics & Gynecology

## 2018-07-03 NOTE — Patient Instructions (Signed)
1. RLQ abdominal pain Probable right functional ovarian cyst in the process of rupture.  Patient reassured.  Precautions reviewed with patient.  Will use ibuprofen regularly 600 mg every 6 hours in the coming 2-3 days.  Recent gonorrhea and Chlamydia negative.  White blood cells normal at 6.3 and hemoglobin 11.6.  Will follow-up for reevaluation if worsening pelvic pain.  2. Vaginal yeast infection Clinical yeast vaginitis.  Will treat with fluconazole 1 tablet per mouth daily for 3 days.  Usage reviewed with patient and prescription sent to pharmacy.  Other orders - fluconazole (DIFLUCAN) 150 MG tablet; Take 1 tablet (150 mg total) by mouth daily for 3 days.  Peter Congo, good seeing you today!

## 2018-11-15 ENCOUNTER — Ambulatory Visit: Payer: Managed Care, Other (non HMO) | Admitting: Obstetrics & Gynecology

## 2018-11-15 ENCOUNTER — Encounter: Payer: Self-pay | Admitting: Obstetrics & Gynecology

## 2018-11-15 VITALS — BP 122/80 | Ht <= 58 in | Wt 122.0 lb

## 2018-11-15 DIAGNOSIS — Z01419 Encounter for gynecological examination (general) (routine) without abnormal findings: Secondary | ICD-10-CM | POA: Diagnosis not present

## 2018-11-15 DIAGNOSIS — Z30011 Encounter for initial prescription of contraceptive pills: Secondary | ICD-10-CM

## 2018-11-15 DIAGNOSIS — Z1151 Encounter for screening for human papillomavirus (HPV): Secondary | ICD-10-CM

## 2018-11-15 DIAGNOSIS — N940 Mittelschmerz: Secondary | ICD-10-CM

## 2018-11-15 MED ORDER — NORETHIN ACE-ETH ESTRAD-FE 1-20 MG-MCG PO TABS
1.0000 | ORAL_TABLET | Freq: Every day | ORAL | 4 refills | Status: DC
Start: 2018-11-15 — End: 2019-08-16

## 2018-11-15 NOTE — Progress Notes (Signed)
Alisha Morris 1981-09-13 [id number]   History:    38 y.o. G1P1L1 Married  RP:  Established patient presenting for annual gyn exam   HPI: Menstrual periods regular every month with normal flow.  No breakthrough bleeding.  Experiencing recurrent painful ovulations.  Was attempting conception but would like to be back on birth control pills for about 6 months.  No pain with intercourse.  Urine and bowel movements normal.  Breasts normal.  Body mass index 26.4.  Not very physically active.  Past medical history,surgical history, family history and social history were all reviewed and documented in the EPIC chart.  Gynecologic History No LMP recorded. Contraception: none Last Pap: 09/2015. Results were: Negative/HPV HR neg Last mammogram: Never Bone Density: Never Colonoscopy: Never  Obstetric History OB History  Gravida Para Term Preterm AB Living  1 1 1     1   SAB TAB Ectopic Multiple Live Births          1    # Outcome Date GA Lbr Len/2nd Weight Sex Delivery Anes PTL Lv  1 Term     M Vag-Spont  N LIV     ROS: A ROS was performed and pertinent positives and negatives are included in the history.  GENERAL: No fevers or chills. HEENT: No change in vision, no earache, sore throat or sinus congestion. NECK: No pain or stiffness. CARDIOVASCULAR: No chest pain or pressure. No palpitations. PULMONARY: No shortness of breath, cough or wheeze. GASTROINTESTINAL: No abdominal pain, nausea, vomiting or diarrhea, melena or bright red blood per rectum. GENITOURINARY: No urinary frequency, urgency, hesitancy or dysuria. MUSCULOSKELETAL: No joint or muscle pain, no back pain, no recent trauma. DERMATOLOGIC: No rash, no itching, no lesions. ENDOCRINE: No polyuria, polydipsia, no heat or cold intolerance. No recent change in weight. HEMATOLOGICAL: No anemia or easy bruising or bleeding. NEUROLOGIC: No headache, seizures, numbness, tingling or weakness. PSYCHIATRIC: No depression, no loss of interest in  normal activity or change in sleep pattern.     Exam:   There were no vitals taken for this visit.  There is no height or weight on file to calculate BMI.  General appearance : Well developed well nourished female. No acute distress HEENT: Eyes: no retinal hemorrhage or exudates,  Neck supple, trachea midline, no carotid bruits, no thyroidmegaly Lungs: Clear to auscultation, no rhonchi or wheezes, or rib retractions  Heart: Regular rate and rhythm, no murmurs or gallops Breast:Examined in sitting and supine position were symmetrical in appearance, no palpable masses or tenderness,  no skin retraction, no nipple inversion, no nipple discharge, no skin discoloration, no axillary or supraclavicular lymphadenopathy Abdomen: no palpable masses or tenderness, no rebound or guarding Extremities: no edema or skin discoloration or tenderness  Pelvic: Vulva: Normal             Vagina: No gross lesions or discharge  Cervix: No gross lesions or discharge.  Pap/HPV HR negative  Uterus  RV, normal size, shape and consistency, non-tender and mobile  Adnexa  Without masses or tenderness  Anus: Normal   Assessment/Plan:  38 y.o. female for annual exam   1. Encounter for routine gynecological examination with Papanicolaou smear of cervix Normal gynecologic exam.  Pap test with high-risk HPV done today.  Breast exam normal.  Body mass index 26.4.  Recommend increased physical activity with aerobic activities 5 times a week and weightlifting every 2 days.  2. Encounter for initial prescription of contraceptive pills Recurrent ovulatory pain.  Decision to  start on birth control pills to control for the next 6 months.  Patient has done well on Janel FE 1/20 in the past.  No contraindication to birth control pills.  Usage reviewed and prescription sent to pharmacy.  3. Ovulatory pain As above.  Other orders - norethindrone-ethinyl estradiol (JUNEL FE,GILDESS FE,LOESTRIN FE) 1-20 MG-MCG tablet; Take 1  tablet by mouth daily.  [name] MD, 9:54 AM 11/15/2018

## 2018-11-15 NOTE — Addendum Note (Signed)
Addended by: Thurnell Garbe A on: 11/15/2018 11:20 AM   Modules accepted: Orders

## 2018-11-15 NOTE — Patient Instructions (Signed)
1. Encounter for routine gynecological examination with Papanicolaou smear of cervix Normal gynecologic exam.  Pap test with high-risk HPV done today.  Breast exam normal.  Body mass index 26.4.  Recommend increased physical activity with aerobic activities 5 times a week and weightlifting every 2 days.  2. Encounter for initial prescription of contraceptive pills Recurrent ovulatory pain.  Decision to start on birth control pills to control for the next 6 months.  Patient has done well on Janel FE 1/20 in the past.  No contraindication to birth control pills.  Usage reviewed and prescription sent to pharmacy.  3. Ovulatory pain As above.  Other orders - norethindrone-ethinyl estradiol (JUNEL FE,GILDESS FE,LOESTRIN FE) 1-20 MG-MCG tablet; Take 1 tablet by mouth daily.  Peter Congo, fue un placer verle hoy!  Voy a informarle de sus [employer].

## 2018-11-17 LAB — PAP, TP IMAGING W/ HPV RNA, RFLX HPV TYPE 16,18/45: HPV DNA HIGH RISK: NOT DETECTED

## 2018-12-14 ENCOUNTER — Telehealth: Payer: Self-pay | Admitting: *Deleted

## 2018-12-14 NOTE — Telephone Encounter (Signed)
-----   Message from Sinclair Grooms sent at 12/14/2018 11:12 AM EDT ----- Regarding: nurse call Patient started taking norethindrone-ethinyl estradiol Feb 24 when her cycle started and she spotted continually until March 16 where she started heavy bleeding again. She wants to know what to do?

## 2018-12-14 NOTE — Telephone Encounter (Signed)
Dr. Dellis Filbert please see the below notes, do you recommend patient just monitor bleeding for now since she is not yet completed 3 packs of birth control pills? Please advise

## 2018-12-14 NOTE — Telephone Encounter (Signed)
Please relay to patient not abnormal to having irregular bleeding when restarting birth control pills, it can take up to 3 packs for body to adjust pills. How often is she saturating a pad or tampon? Every 2-3 hours?

## 2018-12-14 NOTE — Telephone Encounter (Signed)
Changing tampon every 2 hours.

## 2018-12-16 NOTE — Telephone Encounter (Signed)
Will route to [place] to relay, I can send in Rx for ibuprofen if she needs it

## 2018-12-16 NOTE — Telephone Encounter (Signed)
Agree with monitoring until 3 packs completed.  Ibuprofen 600 mg every 6 hours until the flow decreases.

## 2018-12-16 NOTE — Telephone Encounter (Signed)
Detailed message left informing patient of Dr Assunta Curtis note.

## 2019-06-26 ENCOUNTER — Other Ambulatory Visit: Payer: Self-pay

## 2019-06-27 ENCOUNTER — Ambulatory Visit: Payer: Managed Care, Other (non HMO) | Admitting: Women's Health

## 2019-06-27 ENCOUNTER — Encounter: Payer: Self-pay | Admitting: Obstetrics & Gynecology

## 2019-06-27 ENCOUNTER — Ambulatory Visit: Payer: Managed Care, Other (non HMO) | Admitting: Obstetrics & Gynecology

## 2019-06-27 VITALS — BP 124/78

## 2019-06-27 DIAGNOSIS — N9089 Other specified noninflammatory disorders of vulva and perineum: Secondary | ICD-10-CM | POA: Diagnosis not present

## 2019-06-27 DIAGNOSIS — N898 Other specified noninflammatory disorders of vagina: Secondary | ICD-10-CM | POA: Diagnosis not present

## 2019-06-27 DIAGNOSIS — N760 Acute vaginitis: Secondary | ICD-10-CM | POA: Diagnosis not present

## 2019-06-27 DIAGNOSIS — B9689 Other specified bacterial agents as the cause of diseases classified elsewhere: Secondary | ICD-10-CM

## 2019-06-27 LAB — WET PREP FOR TRICH, YEAST, CLUE

## 2019-06-27 MED ORDER — TINIDAZOLE 500 MG PO TABS: 2.0000 g | ORAL_TABLET | Freq: Every day | ORAL | 0 refills | Status: AC

## 2019-06-27 NOTE — Progress Notes (Signed)
    Alisha Morris Mar 01, 1981 [id number]        38 y.o.  G1P1001 Married  RP: Vulvar/vaginal irritation and itching  HPI: Vulvar and vaginal irritation and itching.  Mild increase in vaginal discharge.  No pelvic pain.  Normal menses every month.  No fever.  Declines STI screen.   OB History  Gravida Para Term Preterm AB Living  1 1 1     1   SAB TAB Ectopic Multiple Live Births          1    # Outcome Date GA Lbr Len/2nd Weight Sex Delivery Anes PTL Lv  1 Term     M Vag-Spont  N LIV    Past medical history,surgical history, problem list, medications, allergies, family history and social history were all reviewed and documented in the EPIC chart.   Directed ROS with pertinent positives and negatives documented in the history of present illness/assessment and plan.  Exam:  Vitals:   06/27/19 1415  BP: 124/78   General appearance:  Normal  Gynecologic exam: Vulva/perianal area with mild erythema.  No lesion otherwise.  Speculum:  Cervix/Vagina normal.  Increased vaginal discharge.  Wet prep done.  Wet prep:  Clue cells present   Assessment/Plan:  38 y.o. G1P1001   1. Vaginal itching Vulvar and vaginal irritation and itching.  Wet prep confirming a bacterial vaginosis.  Tinidazole 2 tablets per mouth twice a day for 2 days.  Usage reviewed and prescription sent to pharmacy.  Probiotic tablets for prevention recommended. - WET PREP FOR [name], YEAST, CLUE  2. Vulvar irritation As above, vulvar irritation secondary to increased vaginal discharge from bacterial vaginosis.  3. Bacterial vaginosis Bacterial vaginosis confirmed by wet prep.  Will treat with tinidazole.  Other orders - tinidazole (TINDAMAX) 500 MG tablet; Take 4 tablets (2,000 mg total) by mouth daily for 2 days.  Counseling on above issues and coordination of care more than 50% for 15 minutes.  Alisha Bruins MD, 3:15 PM 06/27/2019

## 2019-06-29 ENCOUNTER — Encounter: Payer: Self-pay | Admitting: Obstetrics & Gynecology

## 2019-06-29 NOTE — Patient Instructions (Signed)
1. Vaginal itching Vulvar and vaginal irritation and itching.  Wet prep confirming a bacterial vaginosis.  Tinidazole 2 tablets per mouth twice a day for 2 days.  Usage reviewed and prescription sent to pharmacy.  Probiotic tablets for prevention recommended. - WET PREP FOR [place], YEAST, CLUE  2. Vulvar irritation As above, vulvar irritation secondary to increased vaginal discharge from bacterial vaginosis.  3. Bacterial vaginosis Bacterial vaginosis confirmed by wet prep.  Will treat with tinidazole.  Other orders - tinidazole (TINDAMAX) 500 MG tablet; Take 4 tablets (2,000 mg total) by mouth daily for 2 days.  Peter Congo, fue un placer verle hoy!

## 2019-08-14 ENCOUNTER — Other Ambulatory Visit: Payer: Self-pay

## 2019-08-16 ENCOUNTER — Encounter: Payer: Self-pay | Admitting: Women's Health

## 2019-08-16 ENCOUNTER — Ambulatory Visit: Payer: Managed Care, Other (non HMO) | Admitting: Women's Health

## 2019-08-16 ENCOUNTER — Other Ambulatory Visit: Payer: Self-pay

## 2019-08-16 VITALS — BP 124/80 | Ht <= 58 in | Wt 125.0 lb

## 2019-08-16 DIAGNOSIS — R300 Dysuria: Secondary | ICD-10-CM | POA: Diagnosis not present

## 2019-08-16 DIAGNOSIS — N898 Other specified noninflammatory disorders of vagina: Secondary | ICD-10-CM

## 2019-08-16 LAB — WET PREP FOR TRICH, YEAST, CLUE

## 2019-08-16 MED ORDER — FLUCONAZOLE 150 MG PO TABS: 150.0000 mg | ORAL_TABLET | Freq: Once | ORAL | 1 refills | Status: AC

## 2019-08-16 NOTE — Patient Instructions (Signed)
Candidiasis vaginal en los adultos Vaginal Yeast infection, Adult  La infeccin mictica vaginal es una afeccin que causa secrecin vaginal y Garment/textile technologist, hinchazn y enrojecimiento (inflamacin) de la vagina. Esta es una enfermedad frecuente. Algunas mujeres contraen esta infeccin con frecuencia. Cules son las causas? La causa de la infeccin es un cambio en el equilibrio normal de los hongos (cndida) y las bacterias que viven en la vagina. Esta alteracin deriva en el crecimiento excesivo de los hongos, lo que causa la inflamacin. Qu incrementa el riesgo? La afeccin es ms probable en las mujeres que tienen estas caractersticas:  Toman antibiticos.  Tienen diabetes.  Toman anticonceptivos orales.  Estn embarazadas.  Se hacen duchas vaginales con frecuencia.  Tienen debilitado el sistema de defensa del organismo (sistema inmunitario).  Han estado tomando medicamentos con corticoesteroides durante [employer].  Usan ropa ajustada con frecuencia. Cules son los signos o los sntomas? Los sntomas de esta afeccin incluyen los siguientes:  Secrecin vaginal blanca, cremosa y espesa.  Hinchazn, picazn, enrojecimiento e irritacin de la vagina. Los labios de la vagina (vulva) tambin se pueden infectar.  Dolor o ardor al Garment/textile technologist.  [place]. Cmo se diagnostica? Esta afeccin se diagnostica en funcin de lo siguiente:  Sus antecedentes mdicos.  Un examen fsico.  Examen plvico. El mdico examinar una muestra de la secrecin vaginal con un microscopio. Probablemente el mdico enve esta muestra al laboratorio para analizarla y confirmar el diagnstico. Cmo se trata? Esta afeccin se trata con medicamentos. [employer] pueden ser recetados o de venta libre. Podrn indicarle que use uno o ms de lo siguiente:  Medicamentos que se toman por boca (orales).  Medicamentos que se aplican como una crema (tpicos).   Medicamentos que se colocan directamente en la vagina (vulos vaginales). Siga estas indicaciones en su casa:  Estilo de vida  No tenga relaciones sexuales hasta que el mdico lo autorice. Comunique a su compaero sexual que tiene una infeccin por hongos. Esa persona debera visitar a su mdico y preguntarle si debera recibir tratamiento tambin.  No use ropa ajustada, como pantimedias o pantalones ajustados.  Use ropa interior de algodn, que permite el paso del aire. Indicaciones generales  Tome o aplquese los medicamentos de venta libre y los recetados solamente como se lo haya indicado el mdico.  Consuma ms yogur. Esto puede ayudar a Technical brewer de la candidiasis.  No use tampones hasta que el mdico la autorice.  Intente darse un bao de asiento para [employer]. Se trata de un bao de agua tibia que se toma mientras se est sentado. El agua solo debe Systems analyst las caderas y cubrir las nalgas. Hgalo 3o 4veces al da o como se lo haya indicado el mdico.  No se haga duchas vaginales.  Si tiene diabetes, mantenga bajo control los niveles de Dispensing optician.  Concurra a todas las visitas de control como se lo haya indicado el mdico. Esto es importante. Comunquese con un mdico si:  Tiene fiebre.  Los sntomas desaparecen y luego vuelven a Arts administrator.  Los sntomas no mejoran con Dispensing optician.  Sus sntomas empeoran.  Aparecen nuevos sntomas.  Aparecen ampollas alrededor o adentro de la vagina.  Le sale sangre de la vagina y no est menstruando.  Siente dolor en el abdomen. Resumen  La infeccin mictica vaginal es una afeccin que causa secrecin y Garment/textile technologist, hinchazn y enrojecimiento (inflamacin) de la vagina.  Esta afeccin se trata con medicamentos. Los  medicamentos pueden ser recetados o de venta libre.  Tome o aplquese los medicamentos de venta libre y los recetados solamente como se lo haya indicado el mdico.  No  se haga duchas vaginales. No tenga relaciones sexuales ni use tampones hasta que el mdico la autorice.  Comunquese con un mdico si los sntomas no mejoran con el tratamiento o si los sntomas desaparecen y [employer]. Esta informacin no tiene Marine scientist el consejo del mdico. Asegrese de hacerle al mdico cualquier pregunta que tenga. Document Released: 06/24/2005 Document Revised: 03/24/2018 Document Reviewed: 03/24/2018 Elsevier Patient Education  2020 [employer].

## 2019-08-16 NOTE — Progress Notes (Signed)
38 y.o. MHF G1P1 presents with complaint vaginal itching and abnormal vaginal discharge for 5 days. Denies odor, back pain, fever, and urinary symptoms. 06/27/2019 treated for bacterial vaginosis, believes current symptoms may be related. Monthly cycles, currently sexually active, same partner, using no contraception pregnancy okay.  Had been on Loestrin but stopped due to missing numerous pills.  Medical history no pertinent medical history. Interpreter present for exam.   Exam: Appears well.  External genitalia normal, speculum exam  vaginal walls erythematous, no visible vaginal discharge. Bimanual no CMT or adnexal tenderness. Wet prep positive for yeast. UA: Negative leukocytes, no WBCs, no RBCs, no bacteria   Vaginal yeast infection   Plan: Diflucan 150 mg PO one dose, 1 refill available if no improvement, prescription given and instructions reviewed. Reviewed yeast prevention, handout in Spanish given. Instructed to call if continued problems.  Contraception discussed options, declines.

## 2019-08-17 LAB — URINALYSIS, COMPLETE W/RFL CULTURE
Bacteria, UA: NONE SEEN /HPF
Bilirubin Urine: NEGATIVE
Glucose, UA: NEGATIVE
Hgb urine dipstick: NEGATIVE
Hyaline Cast: NONE SEEN /LPF
Ketones, ur: NEGATIVE
Leukocyte Esterase: NEGATIVE
Nitrites, Initial: NEGATIVE
Protein, ur: NEGATIVE
RBC / HPF: NONE SEEN /HPF (ref 0–2)
Specific Gravity, Urine: 1.025 (ref 1.001–1.03)
WBC, UA: NONE SEEN /HPF (ref 0–5)
pH: 6.5 (ref 5.0–8.0)

## 2019-08-17 LAB — NO CULTURE INDICATED

## 2019-11-27 ENCOUNTER — Other Ambulatory Visit: Payer: Self-pay | Admitting: Obstetrics & Gynecology

## 2020-07-24 ENCOUNTER — Encounter: Payer: Self-pay | Admitting: Obstetrics & Gynecology

## 2020-07-24 ENCOUNTER — Other Ambulatory Visit: Payer: Self-pay

## 2020-07-24 ENCOUNTER — Ambulatory Visit: Payer: Managed Care, Other (non HMO) | Admitting: Obstetrics & Gynecology

## 2020-07-24 VITALS — BP 118/76 | Ht <= 58 in | Wt 125.0 lb

## 2020-07-24 DIAGNOSIS — Z3169 Encounter for other general counseling and advice on procreation: Secondary | ICD-10-CM

## 2020-07-24 DIAGNOSIS — D259 Leiomyoma of uterus, unspecified: Secondary | ICD-10-CM

## 2020-07-24 DIAGNOSIS — Z01419 Encounter for gynecological examination (general) (routine) without abnormal findings: Secondary | ICD-10-CM

## 2020-07-24 NOTE — Progress Notes (Signed)
Alisha Morris 1981-03-11 [id number]   History:    39 y.o. G1P1L1 Married.  Son is 4 yo.  RP:  Established patient presenting for annual gyn exam   HPI: Menstrual periods regular every month with normal flow.  No breakthrough bleeding.  Attempting conception.  No pain with intercourse.  Pap Neg/HPV HR Neg 10/2018.  Urine and bowel movements normal.  Breasts normal.  Body mass index stable at 26.13.  Not very physically active, but walking a lot at work.  Past medical history,surgical history, family history and social history were all reviewed and documented in the EPIC chart.  Gynecologic History Patient's last menstrual period was 07/01/2020.  Obstetric History OB History  Gravida Para Term Preterm AB Living  1 1 1     1   SAB TAB Ectopic Multiple Live Births          1    # Outcome Date GA Lbr Len/2nd Weight Sex Delivery Anes PTL Lv  1 Term     M Vag-Spont  N LIV     ROS: A ROS was performed and pertinent positives and negatives are included in the history.  GENERAL: No fevers or chills. HEENT: No change in vision, no earache, sore throat or sinus congestion. NECK: No pain or stiffness. CARDIOVASCULAR: No chest pain or pressure. No palpitations. PULMONARY: No shortness of breath, cough or wheeze. GASTROINTESTINAL: No abdominal pain, nausea, vomiting or diarrhea, melena or bright red blood per rectum. GENITOURINARY: No urinary frequency, urgency, hesitancy or dysuria. MUSCULOSKELETAL: No joint or muscle pain, no back pain, no recent trauma. DERMATOLOGIC: No rash, no itching, no lesions. ENDOCRINE: No polyuria, polydipsia, no heat or cold intolerance. No recent change in weight. HEMATOLOGICAL: No anemia or easy bruising or bleeding. NEUROLOGIC: No headache, seizures, numbness, tingling or weakness. PSYCHIATRIC: No depression, no loss of interest in normal activity or change in sleep pattern.     Exam:   BP 118/76   Ht 4\' 10"  (1.473 m)   Wt 125 lb (56.7 kg)   LMP 07/01/2020    BMI 26.13 kg/m   Body mass index is 26.13 kg/m.  General appearance : Well developed well nourished female. No acute distress HEENT: Eyes: no retinal hemorrhage or exudates,  Neck supple, trachea midline, no carotid bruits, no thyroidmegaly Lungs: Clear to auscultation, no rhonchi or wheezes, or rib retractions  Heart: Regular rate and rhythm, no murmurs or gallops Breast:Examined in sitting and supine position were symmetrical in appearance, no palpable masses or tenderness,  no skin retraction, no nipple inversion, no nipple discharge, no skin discoloration, no axillary or supraclavicular lymphadenopathy Abdomen: no palpable masses or tenderness, no rebound or guarding Extremities: no edema or skin discoloration or tenderness  Pelvic: Vulva: Normal             Vagina: No gross lesions or discharge  Cervix: No gross lesions or discharge  Uterus  RV, normal size, nodular with Fibroids, non-tender and mobile  Adnexa  Without masses or tenderness  Anus: Normal   Assessment/Plan:  39 y.o. female for annual exam   1. Well female exam with routine gynecological exam Gynecologic exam with probable small uterine fibroids which are as symptomatic.  Pap tests of February 2020 was negative, no indication to repeat this year.  Breast exam normal.  Body mass index 26.13.  Increase fitness activities.  Continue healthy nutrition.  2. Uterine leiomyoma, unspecified location Asymptomatic small probable uterine fibroids.  Observation at this time.  3. Encounter for  preconception consultation Normal regular menstrual cycle.  No history of tubal disease or gynecologic surgery.  Advanced maternal age at 39 year old.  Desires to continue attempting conception without investigation or treatment.  Recommend prenatal vitamins.  Fitness and healthy nutrition discussed.  [name] MD, 4:06 PM 07/24/2020

## 2020-07-26 ENCOUNTER — Encounter: Payer: Self-pay | Admitting: Obstetrics & Gynecology

## 2021-07-25 ENCOUNTER — Ambulatory Visit (INDEPENDENT_AMBULATORY_CARE_PROVIDER_SITE_OTHER): Payer: Managed Care, Other (non HMO) | Admitting: Obstetrics & Gynecology

## 2021-07-25 ENCOUNTER — Other Ambulatory Visit (HOSPITAL_COMMUNITY)
Admission: RE | Admit: 2021-07-25 | Discharge: 2021-07-25 | Disposition: A | Discharge status: Home / Self Care | Payer: Managed Care, Other (non HMO) | Source: Ambulatory Visit | Attending: Obstetrics & Gynecology | Admitting: Obstetrics & Gynecology

## 2021-07-25 ENCOUNTER — Encounter: Payer: Self-pay | Admitting: Obstetrics & Gynecology

## 2021-07-25 ENCOUNTER — Other Ambulatory Visit: Payer: Self-pay

## 2021-07-25 VITALS — BP 116/70 | HR 76 | Resp 16 | Ht 58.25 in | Wt 132.0 lb

## 2021-07-25 DIAGNOSIS — Z3009 Encounter for other general counseling and advice on contraception: Secondary | ICD-10-CM | POA: Diagnosis not present

## 2021-07-25 DIAGNOSIS — Z01419 Encounter for gynecological examination (general) (routine) without abnormal findings: Secondary | ICD-10-CM

## 2021-07-25 DIAGNOSIS — N9089 Other specified noninflammatory disorders of vulva and perineum: Secondary | ICD-10-CM | POA: Diagnosis not present

## 2021-07-25 LAB — WET PREP FOR TRICH, YEAST, CLUE

## 2021-07-25 MED ORDER — NYSTATIN-TRIAMCINOLONE 100000-0.1 UNIT/GM-% EX OINT: 1.0000 "application " | TOPICAL_OINTMENT | Freq: Every day | CUTANEOUS | 1 refills | Status: DC | PRN

## 2021-07-25 NOTE — Progress Notes (Addendum)
Alisha Morris 08/17/1981 [id number]   History:    40 y.o. G1P1L1 [place]. Son is 103 yo.   RP:  Established patient presenting for annual gyn exam    HPI: Menstrual periods regular every month with normal flow.  No breakthrough bleeding.  Vulvar irritation before her menses. [place] if conceives.  No pain with intercourse.  Pap Neg/HPV HR Neg 10/2018.  Urine and bowel movements normal.  Breasts normal.  Body mass index stable at 27.35.  Not very physically active, but walking a lot at work.  Past medical history,surgical history, family history and social history were all reviewed and documented in the EPIC chart.  Gynecologic History Patient's last menstrual period was 07/01/2021 (exact date).  Obstetric History OB History  Gravida Para Term Preterm AB Living  1 1 1     1   SAB IAB Ectopic Multiple Live Births          1    # Outcome Date GA Lbr Len/2nd Weight Sex Delivery Anes PTL Lv  1 Term     M Vag-Spont  N LIV     ROS: A ROS was performed and pertinent positives and negatives are included in the history.  GENERAL: No fevers or chills. HEENT: No change in vision, no earache, sore throat or sinus congestion. NECK: No pain or stiffness. CARDIOVASCULAR: No chest pain or pressure. No palpitations. PULMONARY: No shortness of breath, cough or wheeze. GASTROINTESTINAL: No abdominal pain, nausea, vomiting or diarrhea, melena or bright red blood per rectum. GENITOURINARY: No urinary frequency, urgency, hesitancy or dysuria. MUSCULOSKELETAL: No joint or muscle pain, no back pain, no recent trauma. DERMATOLOGIC: No rash, no itching, no lesions. ENDOCRINE: No polyuria, polydipsia, no heat or cold intolerance. No recent change in weight. HEMATOLOGICAL: No anemia or easy bruising or bleeding. NEUROLOGIC: No headache, seizures, numbness, tingling or weakness. PSYCHIATRIC: No depression, no loss of interest in normal activity or change in sleep pattern.     Exam:   BP 116/70   Pulse 76   Resp 16    Ht 4' 10.25" (1.48 m)   Wt 132 lb (59.9 kg)   LMP 07/01/2021 (Exact Date)   BMI 27.35 kg/m   Body mass index is 27.35 kg/m.  General appearance : Well developed well nourished female. No acute distress HEENT: Eyes: no retinal hemorrhage or exudates,  Neck supple, trachea midline, no carotid bruits, no thyroidmegaly Lungs: Clear to auscultation, no rhonchi or wheezes, or rib retractions  Heart: Regular rate and rhythm, no murmurs or gallops Breast:Examined in sitting and supine position were symmetrical in appearance, no palpable masses or tenderness,  no skin retraction, no nipple inversion, no nipple discharge, no skin discoloration, no axillary or supraclavicular lymphadenopathy Abdomen: no palpable masses or tenderness, no rebound or guarding Extremities: no edema or skin discoloration or tenderness  Pelvic: Vulva: Normal             Vagina: No gross lesions or discharge.  Wet prep done.  Cervix: No gross lesions or discharge.  Pap reflex done.  Uterus  AV, normal size, shape and consistency, non-tender and mobile  Adnexa  Without masses or tenderness  Anus: Normal  Wet prep Neg   Assessment/Plan:  40 y.o. female for annual exam   1. Encounter for routine gynecological examination with Papanicolaou smear of cervix Menstrual periods regular every month with normal flow.  No breakthrough bleeding.  Vulvar irritation before her menses. [place] if conceives.  No pain with intercourse.  Pap  Neg/HPV HR Neg 10/2018.  Pap reflex test done today.  Urine and bowel movements normal.  Breasts normal.  Body mass index stable at 27.35.  Not very physically active, but walking a lot at work. - Cytology - PAP( [place])  2. Encounter for other general counseling or advice on contraception Declines contraception.  Would welcome a pregnancy.  3. Vulvar irritation Wet prep Neg.  Mycolog on vulva daily PRN.  Prescription sent to pharmacy. - WET PREP FOR [place], YEAST, CLUE  Other orders -  nystatin-triamcinolone ointment (MYCOLOG); Apply 1 application topically daily as needed. Vulvar application as needed.   [name] MD, 3:32 PM 07/25/2021

## 2021-07-28 LAB — CYTOLOGY - PAP: Diagnosis: NEGATIVE

## 2021-09-02 ENCOUNTER — Other Ambulatory Visit: Payer: Self-pay | Admitting: Obstetrics & Gynecology

## 2021-09-02 DIAGNOSIS — Z1231 Encounter for screening mammogram for malignant neoplasm of breast: Secondary | ICD-10-CM

## 2021-10-08 ENCOUNTER — Ambulatory Visit
Admission: RE | Admit: 2021-10-08 | Discharge: 2021-10-08 | Disposition: A | Discharge status: Home / Self Care | Payer: Managed Care, Other (non HMO) | Source: Ambulatory Visit | Attending: Obstetrics & Gynecology | Admitting: Obstetrics & Gynecology

## 2021-10-08 DIAGNOSIS — Z1231 Encounter for screening mammogram for malignant neoplasm of breast: Secondary | ICD-10-CM

## 2022-07-28 ENCOUNTER — Encounter: Payer: Self-pay | Admitting: Obstetrics & Gynecology

## 2022-07-28 ENCOUNTER — Other Ambulatory Visit (HOSPITAL_COMMUNITY)
Admission: RE | Admit: 2022-07-28 | Discharge: 2022-07-28 | Disposition: A | Discharge status: Home / Self Care | Payer: Managed Care, Other (non HMO) | Source: Ambulatory Visit | Attending: Obstetrics & Gynecology | Admitting: Obstetrics & Gynecology

## 2022-07-28 ENCOUNTER — Ambulatory Visit (INDEPENDENT_AMBULATORY_CARE_PROVIDER_SITE_OTHER): Payer: Managed Care, Other (non HMO) | Admitting: Obstetrics & Gynecology

## 2022-07-28 VITALS — BP 106/68 | HR 86 | Ht 58.25 in | Wt 133.0 lb

## 2022-07-28 DIAGNOSIS — Z3009 Encounter for other general counseling and advice on contraception: Secondary | ICD-10-CM

## 2022-07-28 DIAGNOSIS — Z113 Encounter for screening for infections with a predominantly sexual mode of transmission: Secondary | ICD-10-CM

## 2022-07-28 DIAGNOSIS — Z01419 Encounter for gynecological examination (general) (routine) without abnormal findings: Secondary | ICD-10-CM | POA: Diagnosis present

## 2022-07-28 NOTE — Progress Notes (Signed)
Alisha Morris Jun 27, 1981 [id number]   History:    41 y.o. G1P1L1 Single. Son is 29 yo.   RP:  Established patient presenting for annual gyn exam    HPI: Menstrual periods regular every month with normal flow.  No breakthrough bleeding.  No pelvic pain.  No pain with intercourse.  Pap Neg 06/2021.  Pap reflex/Gono-Chlam today.  Urine and bowel movements normal.  Breasts normal. Mammo Neg 09/2021.  Body mass index stable at 27.56.  Walking the dog, just go a Husky.    PAP: 07-25-21, MMG: 10-08-21  Past medical history,surgical history, family history and social history were all reviewed and documented in the EPIC chart.  Gynecologic History Patient's last menstrual period was 07/07/2022 (exact date).  Obstetric History OB History  Gravida Para Term Preterm AB Living  '1 1 1     1  '$ SAB IAB Ectopic Multiple Live Births          1    # Outcome Date GA Lbr Len/2nd Weight Sex Delivery Anes PTL Lv  1 Term     M Vag-Spont  N LIV     ROS: A ROS was performed and pertinent positives and negatives are included in the history.  GENERAL: No fevers or chills. HEENT: No change in vision, no earache, sore throat or sinus congestion. NECK: No pain or stiffness. CARDIOVASCULAR: No chest pain or pressure. No palpitations. PULMONARY: No shortness of breath, cough or wheeze. GASTROINTESTINAL: No abdominal pain, nausea, vomiting or diarrhea, melena or bright red blood per rectum. GENITOURINARY: No urinary frequency, urgency, hesitancy or dysuria. MUSCULOSKELETAL: No joint or muscle pain, no back pain, no recent trauma. DERMATOLOGIC: No rash, no itching, no lesions. ENDOCRINE: No polyuria, polydipsia, no heat or cold intolerance. No recent change in weight. HEMATOLOGICAL: No anemia or easy bruising or bleeding. NEUROLOGIC: No headache, seizures, numbness, tingling or weakness. PSYCHIATRIC: No depression, no loss of interest in normal activity or change in sleep pattern.     Exam:   BP 106/68   Pulse 86    Ht 4' 10.25" (1.48 m)   Wt 133 lb (60.3 kg)   LMP 07/07/2022 (Exact Date)   SpO2 99%   BMI 27.56 kg/m   Body mass index is 27.56 kg/m.  General appearance : Well developed well nourished female. No acute distress HEENT: Eyes: no retinal hemorrhage or exudates,  Neck supple, trachea midline, no carotid bruits, no thyroidmegaly Lungs: Clear to auscultation, no rhonchi or wheezes, or rib retractions  Heart: Regular rate and rhythm, no murmurs or gallops Breast:Examined in sitting and supine position were symmetrical in appearance, no palpable masses or tenderness,  no skin retraction, no nipple inversion, no nipple discharge, no skin discoloration, no axillary or supraclavicular lymphadenopathy Abdomen: no palpable masses or tenderness, no rebound or guarding Extremities: no edema or skin discoloration or tenderness  Pelvic: Vulva: Normal             Vagina: No gross lesions or discharge  Cervix: No gross lesions or discharge.  Pap reflex/Gono-Chlam done.  Uterus  AV, normal size, shape and consistency, non-tender and mobile  Adnexa  Without masses or tenderness  Anus: Normal   Assessment/Plan:  41 y.o. female for annual exam   1. Encounter for routine gynecological examination with Papanicolaou smear of cervix Menstrual periods regular every month with normal flow.  No breakthrough bleeding.  No pelvic pain. No pain with intercourse.  Pap Neg 06/2021.  Pap reflex/Gono-Chlam today.  Urine and bowel  movements normal.  Breasts normal. Mammo Neg 09/2021.  Body mass index stable at 27.56.  Walking the dog, just go a Husky. - Cytology - PAP( [place])  2. Encounter for other general counseling or advice on contraception Currently abstinent.  Condoms as needed.  3. Screen for STD (sexually transmitted disease) Gono-Chlam on Pap. - Cytology - PAP( [place])   [name] MD, 3:12 PM 07/28/2022

## 2022-07-30 LAB — CYTOLOGY - PAP
Chlamydia: NEGATIVE
Comment: NEGATIVE
Comment: NORMAL
Diagnosis: NEGATIVE
Neisseria Gonorrhea: NEGATIVE

## 2022-10-13 ENCOUNTER — Other Ambulatory Visit: Payer: Self-pay | Admitting: Obstetrics & Gynecology

## 2022-10-13 DIAGNOSIS — Z1231 Encounter for screening mammogram for malignant neoplasm of breast: Secondary | ICD-10-CM

## 2022-10-26 ENCOUNTER — Ambulatory Visit
Admission: RE | Admit: 2022-10-26 | Discharge: 2022-10-26 | Disposition: A | Discharge status: Home / Self Care | Payer: Managed Care, Other (non HMO) | Source: Ambulatory Visit | Attending: Obstetrics & Gynecology | Admitting: Obstetrics & Gynecology

## 2022-10-26 DIAGNOSIS — Z1231 Encounter for screening mammogram for malignant neoplasm of breast: Secondary | ICD-10-CM

## 2023-07-30 ENCOUNTER — Ambulatory Visit: Payer: Managed Care, Other (non HMO) | Admitting: Obstetrics & Gynecology

## 2023-08-02 ENCOUNTER — Other Ambulatory Visit (HOSPITAL_COMMUNITY)
Admission: RE | Admit: 2023-08-02 | Discharge: 2023-08-02 | Disposition: A | Discharge status: Home / Self Care | Payer: Managed Care, Other (non HMO) | Source: Ambulatory Visit | Attending: Obstetrics and Gynecology | Admitting: Obstetrics and Gynecology

## 2023-08-02 ENCOUNTER — Ambulatory Visit (INDEPENDENT_AMBULATORY_CARE_PROVIDER_SITE_OTHER): Payer: Managed Care, Other (non HMO) | Admitting: Obstetrics and Gynecology

## 2023-08-02 ENCOUNTER — Encounter: Payer: Self-pay | Admitting: Obstetrics and Gynecology

## 2023-08-02 VITALS — BP 118/80 | HR 77 | Ht 58.25 in | Wt 133.0 lb

## 2023-08-02 DIAGNOSIS — N920 Excessive and frequent menstruation with regular cycle: Secondary | ICD-10-CM

## 2023-08-02 DIAGNOSIS — N946 Dysmenorrhea, unspecified: Secondary | ICD-10-CM

## 2023-08-02 DIAGNOSIS — Z01419 Encounter for gynecological examination (general) (routine) without abnormal findings: Secondary | ICD-10-CM

## 2023-08-02 DIAGNOSIS — D219 Benign neoplasm of connective and other soft tissue, unspecified: Secondary | ICD-10-CM

## 2023-08-02 MED ORDER — NORETHIN-ETH ESTRAD-FE BIPHAS 1 MG-10 MCG / 10 MCG PO TABS: 1.0000 | ORAL_TABLET | Freq: Every day | ORAL | 3 refills | Status: AC

## 2023-08-02 NOTE — Progress Notes (Signed)
42 y.o. y.o. female here for annual exam.   Patient's last menstrual period was 07/20/2023 (exact date). Period Duration (Days): 3 Period Pattern: Regular Menstrual Flow: Heavy Menstrual Control: Tampon Dysmenorrhea: (!) Severe Dysmenorrhea Symptoms: Cramping, Nausea  Periods are painful and have been getting worse.  She has to leave work due to the pain. She is bleeding a pad an hour for 3 days and has been found to be anemic She has tried ocp's in the past and this lightened them Pap: h/o cryo Last mammogram: 10/26/22   Blood pressure 118/80, pulse 77, height 4' 10.25" (1.48 m), weight 133 lb (60.3 kg), last menstrual period 07/20/2023, SpO2 99%.     Component Value Date/Time   DIAGPAP  07/28/2022 1521    - Negative for intraepithelial lesion or malignancy (NILM)   DIAGPAP  07/25/2021 1556    - Negative for intraepithelial lesion or malignancy (NILM)   ADEQPAP  07/28/2022 1521    Satisfactory for evaluation; transformation zone component PRESENT.   ADEQPAP  07/25/2021 1556    Satisfactory for evaluation; transformation zone component PRESENT.    GYN HISTORY:    Component Value Date/Time   DIAGPAP  07/28/2022 1521    - Negative for intraepithelial lesion or malignancy (NILM)   DIAGPAP  07/25/2021 1556    - Negative for intraepithelial lesion or malignancy (NILM)   ADEQPAP  07/28/2022 1521    Satisfactory for evaluation; transformation zone component PRESENT.   ADEQPAP  07/25/2021 1556    Satisfactory for evaluation; transformation zone component PRESENT.    OB History  Gravida Para Term Preterm AB Living  1 1 1     1   SAB IAB Ectopic Multiple Live Births          1    # Outcome Date GA Lbr Len/2nd Weight Sex Type Anes PTL Lv  1 Term     M Vag-Spont  N LIV    Past Medical History:  Diagnosis Date   High risk HPV infection 11/18/2007   CIN I    Past Surgical History:  Procedure Laterality Date   GYNECOLOGIC CRYOSURGERY  11/18/2007   PELVIC LAPAROSCOPY   11/02/08    No current outpatient medications on file prior to visit.   No current facility-administered medications on file prior to visit.    Social History   Socioeconomic History   Marital status: Divorced    Spouse name: Not on file   Number of children: Not on file   Years of education: Not on file   Highest education level: Not on file  Occupational History   Not on file  Tobacco Use   Smoking status: Never   Smokeless tobacco: Never  Vaping Use   Vaping status: Never Used  Substance and Sexual Activity   Alcohol use: No    Alcohol/week: 0.0 standard drinks of alcohol   Drug use: No   Sexual activity: Yes    Partners: Male    Birth control/protection: None    Comment: 1ST INTERCOURSE- 89, PARTNERS- 3   Other Topics Concern   Not on file  Social History Narrative   Not on file   Social Determinants of Health   Financial Resource Strain: Not on file  Food Insecurity: Not on file  Transportation Needs: Not on file  Physical Activity: Not on file  Stress: Not on file  Social Connections: Not on file  Intimate Partner Violence: Not on file    History reviewed. No pertinent family history.  No Known Allergies    Patient's last menstrual period was Patient's last menstrual period was 07/20/2023 (exact date)..            Review of Systems Alls systems reviewed and are negative.     Physical Exam Constitutional:      Appearance: Normal appearance.  Genitourinary:     Vulva and urethral meatus normal.     No lesions in the vagina.     Genitourinary Comments: Fibroids palpated     Right Labia: No rash, lesions or skin changes.    Left Labia: No lesions, skin changes or rash.    Vaginal discharge present.     No vaginal tenderness.     No vaginal prolapse present.    No vaginal atrophy present.     Right Adnexa: not tender, not palpable and no mass present.    Left Adnexa: not tender, not palpable and no mass present.    No cervical motion  tenderness or discharge.     Uterus is enlarged, tender and irregular.     Uterus is anteverted.  Breasts:    Right: Normal.     Left: Normal.  HENT:     Head: Normocephalic.  Neck:     Thyroid: No thyroid mass, thyromegaly or thyroid tenderness.  Cardiovascular:     Rate and Rhythm: Normal rate and regular rhythm.     Heart sounds: Normal heart sounds, S1 normal and S2 normal.  Pulmonary:     Effort: Pulmonary effort is normal.     Breath sounds: Normal breath sounds and air entry.  Abdominal:     General: There is no distension.     Palpations: Abdomen is soft. There is no mass.     Tenderness: There is no abdominal tenderness. There is no guarding or rebound.  Musculoskeletal:        General: Normal range of motion.     Cervical back: Full passive range of motion without pain, normal range of motion and neck supple. No tenderness.     Right lower leg: No edema.     Left lower leg: No edema.  Neurological:     Mental Status: She is alert.  Skin:    General: Skin is warm.  Psychiatric:        Mood and Affect: Mood normal.        Behavior: Behavior normal.        Thought Content: Thought content normal.  Vitals and nursing note reviewed. Exam conducted with a chaperone present.       A:         Well Woman GYN exam                             P:        Pap smear collected today Referral for TV US to evaluate fibroids.  She would like to try birth control again for the bleeding and dysmenorrhea.  She will follow in 3 months after starting. All options discussed with patient Encouraged healthy lifestyle practices Nu swab sent to rule out infection  No follow-ups on file.  Earley Favor

## 2023-08-03 LAB — SURESWAB® ADVANCED VAGINITIS PLUS,TMA
C. trachomatis RNA, TMA: NOT DETECTED
CANDIDA SPECIES: NOT DETECTED
Candida glabrata: NOT DETECTED
N. gonorrhoeae RNA, TMA: NOT DETECTED
SURESWAB(R) ADV BACTERIAL VAGINOSIS(BV),TMA: NEGATIVE
TRICHOMONAS VAGINALIS (TV),TMA: NOT DETECTED

## 2023-08-09 LAB — CYTOLOGY - PAP
Adequacy: ABSENT
Comment: NEGATIVE
Comment: NEGATIVE
Comment: NEGATIVE
HPV 16: NEGATIVE
HPV 18 / 45: NEGATIVE
High risk HPV: POSITIVE — AB

## 2023-08-10 ENCOUNTER — Other Ambulatory Visit: Payer: Self-pay

## 2023-08-10 DIAGNOSIS — R87618 Other abnormal cytological findings on specimens from cervix uteri: Secondary | ICD-10-CM

## 2023-08-16 IMAGING — MG MM DIGITAL SCREENING BILAT W/ TOMO AND CAD
6 of 10 series · 6 of 30 positions shown · non-contrast
Comparison: None.

CLINICAL DATA: Screening.

EXAM:
DIGITAL SCREENING BILATERAL MAMMOGRAM WITH TOMOSYNTHESIS AND CAD
TECHNIQUE: Bilateral screening digital craniocaudal and mediolateral oblique
mammograms were obtained. Bilateral screening digital breast
tomosynthesis was performed. The images were evaluated with
computer-aided detection.

[R CC synth-2D (1 of 2)]
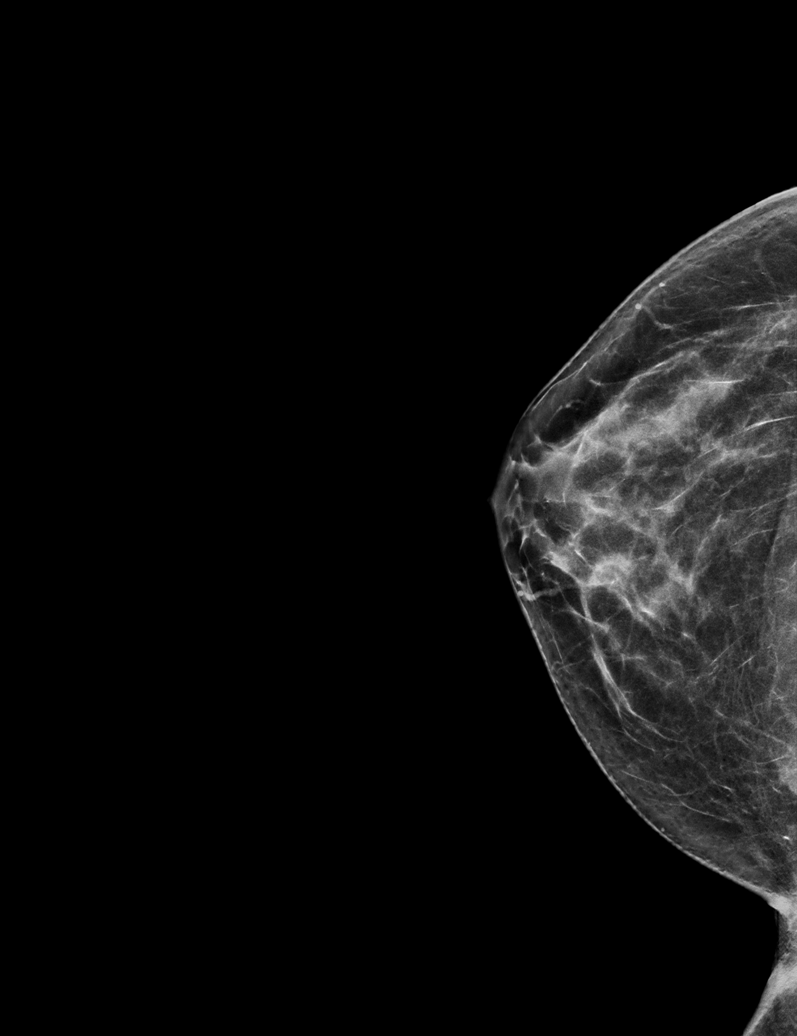

[R MLO synth-2D]
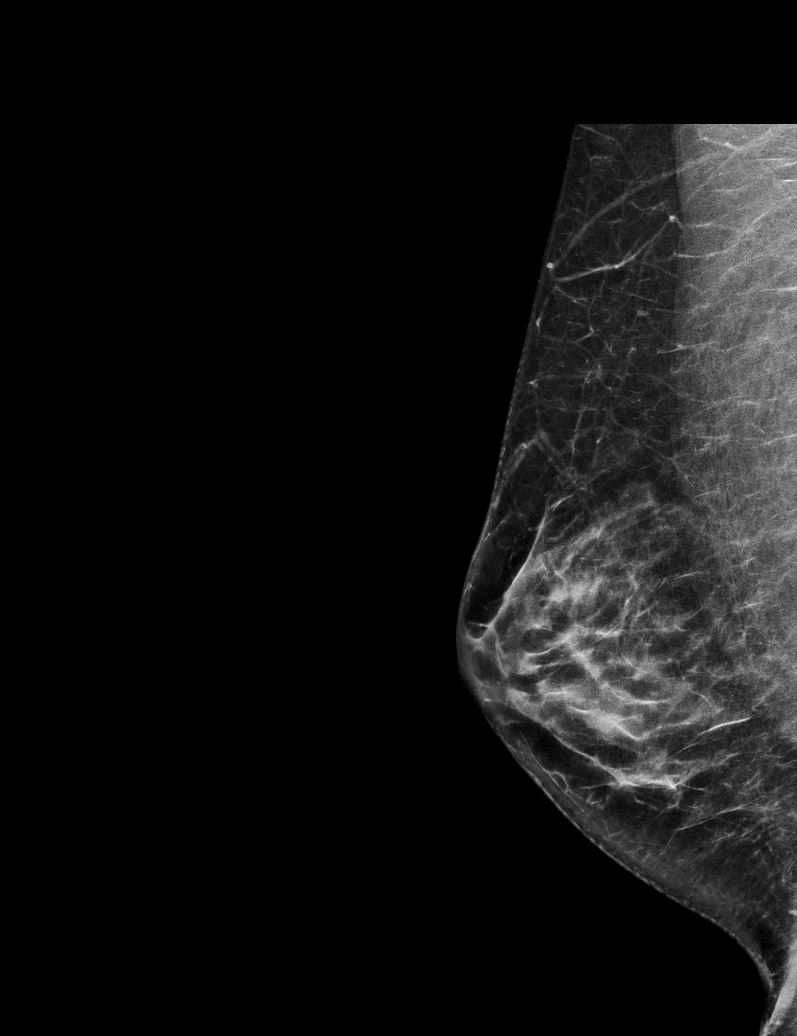

[R CC synth-2D (2 of 2)]
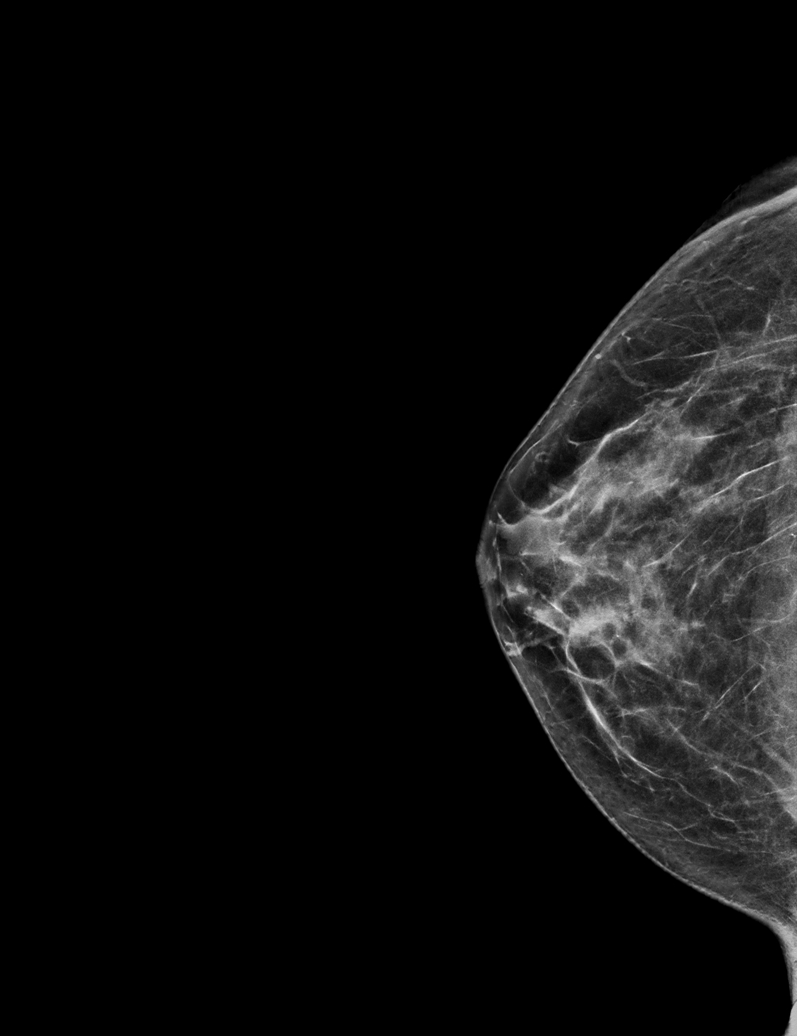

[L MLO synth-2D]
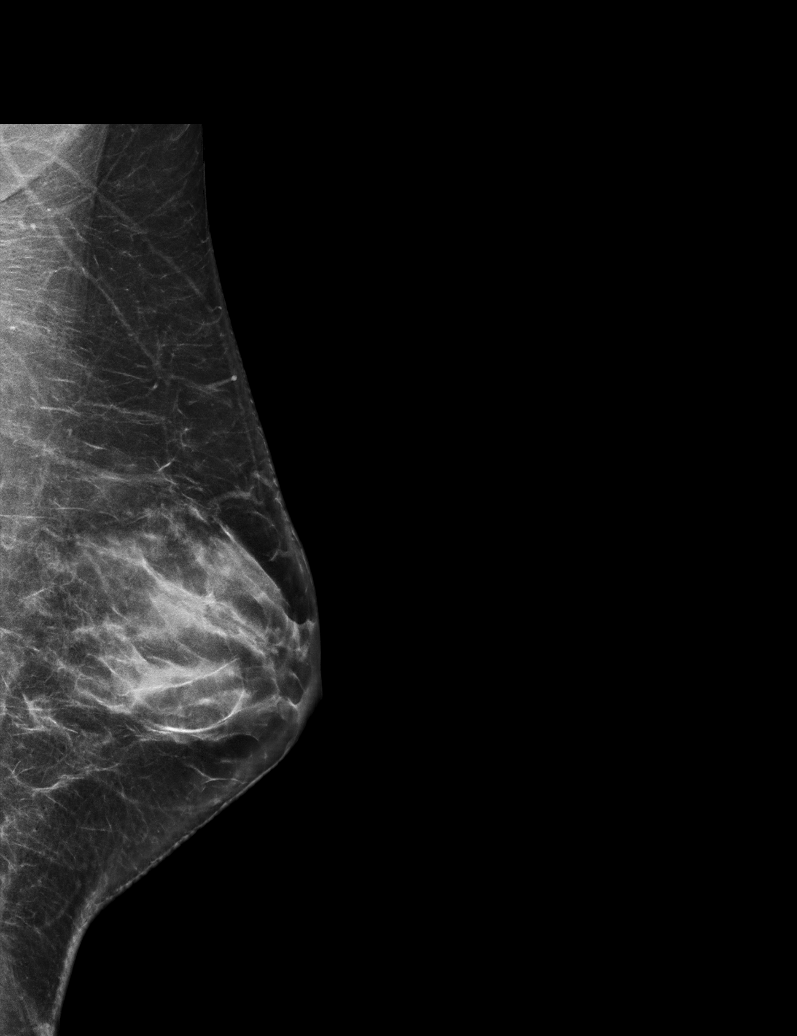

[L CC synth-2D]
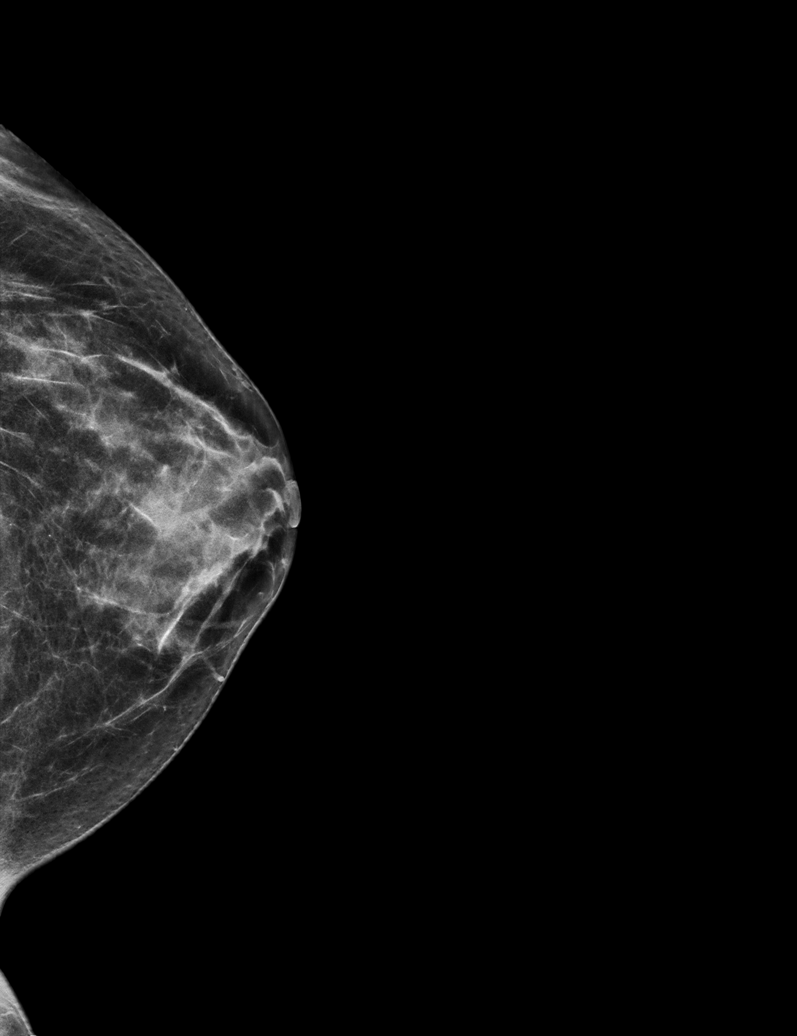

[L CC tomo · tomo slice 28/55.0]
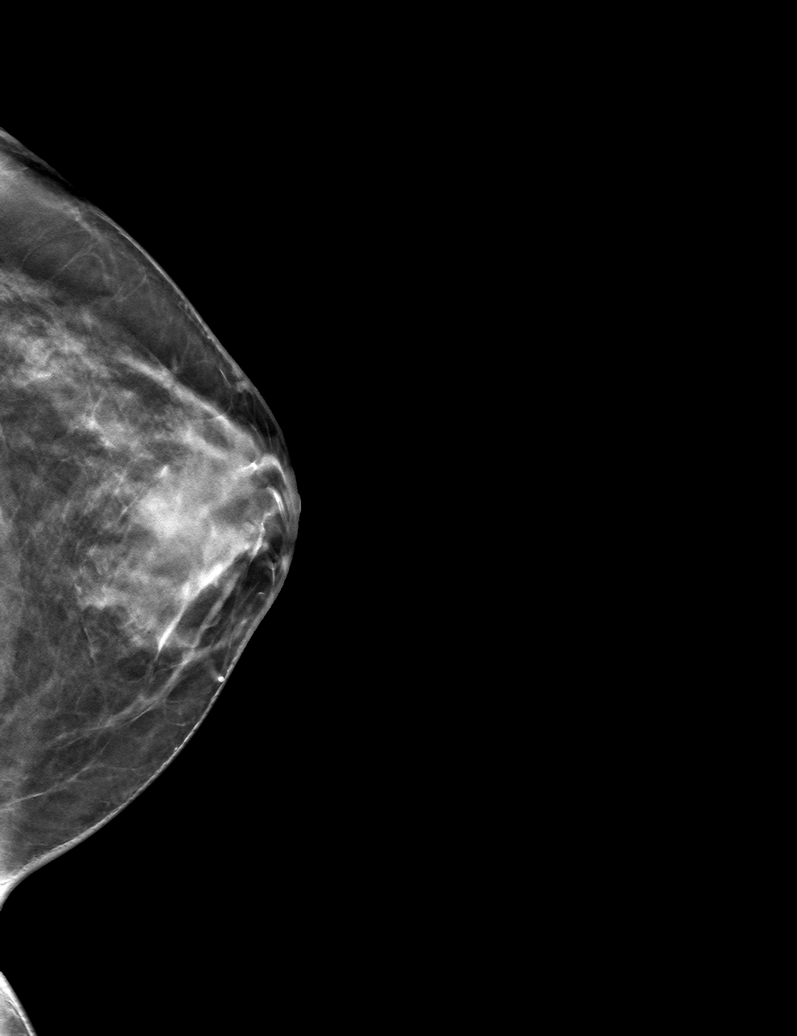

[6 of 30 positions shown; findings below may reference images not displayed]

ACR Breast Density Category c: The breast tissue is heterogeneously
dense, which may obscure small masses
FINDINGS: There are no findings suspicious for malignancy.
IMPRESSION: No mammographic evidence of malignancy. A result letter of this
screening mammogram will be mailed directly to the patient.

RECOMMENDATION:
Screening mammogram in one year. (Code:C8-T-HNK)

BI-RADS CATEGORY  1: Negative.

## 2023-08-31 ENCOUNTER — Ambulatory Visit (INDEPENDENT_AMBULATORY_CARE_PROVIDER_SITE_OTHER): Payer: Managed Care, Other (non HMO)

## 2023-08-31 ENCOUNTER — Encounter: Payer: Self-pay | Admitting: Obstetrics and Gynecology

## 2023-08-31 ENCOUNTER — Ambulatory Visit: Payer: Managed Care, Other (non HMO) | Admitting: Obstetrics and Gynecology

## 2023-08-31 VITALS — BP 110/70

## 2023-08-31 DIAGNOSIS — D219 Benign neoplasm of connective and other soft tissue, unspecified: Secondary | ICD-10-CM

## 2023-08-31 DIAGNOSIS — D259 Leiomyoma of uterus, unspecified: Secondary | ICD-10-CM | POA: Diagnosis not present

## 2023-08-31 DIAGNOSIS — N946 Dysmenorrhea, unspecified: Secondary | ICD-10-CM | POA: Diagnosis not present

## 2023-08-31 DIAGNOSIS — N83299 Other ovarian cyst, unspecified side: Secondary | ICD-10-CM | POA: Diagnosis not present

## 2023-08-31 DIAGNOSIS — Z712 Person consulting for explanation of examination or test findings: Secondary | ICD-10-CM

## 2023-08-31 DIAGNOSIS — N920 Excessive and frequent menstruation with regular cycle: Secondary | ICD-10-CM

## 2023-08-31 NOTE — Progress Notes (Signed)
Patient presents for follow-up of fibroids.  She has not started ocp's.  Prior US results: T/V images.  Retroverted uterus measuring 3.16 x 5.64 x 4.36 cm.  Endometrial lining  at 14.4 mm.  Intramural fibroids measuring 2.3 x 1.9 cm, 4.8 x 4.8 cm.  Left ovary normal.  Right ovary with a thick-walled cyst with positive color flow Doppler at the periphery periphery, internal low-level echoes, measuring 2.3 x 2.0 cm.  Free fluid in the right posterior cul-de-sac and around the right ovary measuring 5.6 x 1.9 x 3.9 cm.     Today [place]  Retroverted 8.4cm uterus.  Thin symmetrical endometrium measuring 1.067cm.  Prior US was 1.4cm.  Two fibroids measured.  The first 4.17 x 3.84 and second 4.06 x 3.95cm Right ovary with 3.7 x 3.2 cm hemorrhagic cyst left ovary with a 3.3 x 1.4 cm simple avascular cyst. No adnexal masses no free fluid .  A/p thickened endometrial lining.  Fibroid uterus Counseled on the causes for the lining being thickened with a hyperplasia, cancer, submucosal fibroid and/or endometrial polyp.  Regarding Tuesday for hysterogram and endometrial biopsy.  Counseled on the importance endometrial biopsy.  2.   Patient desires conservative care for the fibroids at this point, and is planning to start birth control 3.  Discussed likely resolution of the ovarian cyst following cycle and usually are from ovulation 20 minutes spent on reviewing records, imaging,  and one on one patient time and counseling patient and documentation Dr. Karma Greaser

## 2023-09-02 ENCOUNTER — Telehealth: Payer: Self-pay | Admitting: Obstetrics and Gynecology

## 2023-09-02 DIAGNOSIS — R9389 Abnormal findings on diagnostic imaging of other specified body structures: Secondary | ICD-10-CM

## 2023-09-02 NOTE — Telephone Encounter (Signed)
orders

## 2023-09-07 ENCOUNTER — Ambulatory Visit: Payer: Managed Care, Other (non HMO) | Admitting: Obstetrics and Gynecology

## 2023-09-07 ENCOUNTER — Other Ambulatory Visit (HOSPITAL_COMMUNITY)
Admission: RE | Admit: 2023-09-07 | Discharge: 2023-09-07 | Disposition: A | Discharge status: Home / Self Care | Payer: Managed Care, Other (non HMO) | Source: Ambulatory Visit | Attending: Obstetrics and Gynecology | Admitting: Obstetrics and Gynecology

## 2023-09-07 ENCOUNTER — Encounter: Payer: Self-pay | Admitting: Obstetrics and Gynecology

## 2023-09-07 VITALS — BP 110/70 | HR 84

## 2023-09-07 DIAGNOSIS — R87618 Other abnormal cytological findings on specimens from cervix uteri: Secondary | ICD-10-CM

## 2023-09-07 DIAGNOSIS — N859 Noninflammatory disorder of uterus, unspecified: Secondary | ICD-10-CM

## 2023-09-07 DIAGNOSIS — Z01812 Encounter for preprocedural laboratory examination: Secondary | ICD-10-CM

## 2023-09-07 DIAGNOSIS — N898 Other specified noninflammatory disorders of vagina: Secondary | ICD-10-CM

## 2023-09-07 LAB — WET PREP FOR TRICH, YEAST, CLUE

## 2023-09-07 LAB — PREGNANCY, URINE: Preg Test, Ur: NEGATIVE

## 2023-09-07 MED ORDER — METRONIDAZOLE 500 MG PO TABS: 500.0000 mg | ORAL_TABLET | Freq: Two times a day (BID) | ORAL | 0 refills | Status: DC

## 2023-09-07 NOTE — Progress Notes (Signed)
Colposcopy Procedure Note Alisha Morris 09/07/2023  Indications: 08/02/23 LGSIL  Procedure Details  The risks and benefits of the procedure and Written informed consent obtained.  Speculum placed in vagina and excellent visualization of cervix achieved, cervix swabbed x 3 with acetic acid solution.  Impression:CIN1  Satisfactory( ECC zone seen): yes  Findings:  Cervix colposcopy biopsy taken: 1 O'clock for acetowhite changes  ECC performed with cytobrush Vaginal discharge seen. Wet mount with BV  Hemostasis obtained with application of Monsel's Solution    Complications:    Patient tolerated the procedure well  Plan:  To notify patient in epic and phone call of results and plan of care Discussed she must have two consecutive normal pap smears Q28months before returning to annual pap smears Flagyl sent for treatment of BV RTC for EMB and SHG for thickened lining on [place]   Earley Favor

## 2023-09-07 NOTE — Progress Notes (Signed)
BV was seen.  I have sent in flagyl to take.  Do not take and drink alcohol.

## 2023-09-09 ENCOUNTER — Encounter: Payer: Self-pay | Admitting: Obstetrics and Gynecology

## 2023-09-09 LAB — SURGICAL PATHOLOGY

## 2023-10-06 ENCOUNTER — Encounter: Payer: Self-pay | Admitting: Obstetrics and Gynecology

## 2023-10-06 ENCOUNTER — Ambulatory Visit: Payer: Managed Care, Other (non HMO) | Admitting: Obstetrics and Gynecology

## 2023-10-06 VITALS — BP 114/68 | HR 80

## 2023-10-06 DIAGNOSIS — D219 Benign neoplasm of connective and other soft tissue, unspecified: Secondary | ICD-10-CM

## 2023-10-06 DIAGNOSIS — N920 Excessive and frequent menstruation with regular cycle: Secondary | ICD-10-CM

## 2023-10-06 DIAGNOSIS — N859 Noninflammatory disorder of uterus, unspecified: Secondary | ICD-10-CM

## 2023-10-06 DIAGNOSIS — Z8742 Personal history of other diseases of the female genital tract: Secondary | ICD-10-CM | POA: Diagnosis not present

## 2023-10-06 DIAGNOSIS — N870 Mild cervical dysplasia: Secondary | ICD-10-CM | POA: Diagnosis not present

## 2023-10-07 NOTE — Progress Notes (Signed)
 43 y.o. y.o. female here to discuss results and options for treatment. Patient with heavy painful periods.  She does not desire more children, but is not ready for a RLH. 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   Patient's last menstrual period was 09/07/2023.    Periods are painful and have been getting worse.  She has to leave work due to the pain. She is bleeding a pad an hour for 3 days and has been found to be anemic She has tried ocp's in the past and this lightened them Pap: h/o cryo Last mammogram: 10/26/22  08/31/23 Narrative & Impression  Retroverted 8.4cm uterus.  Thin symmetrical endometrium measuring 1.067cm.  Prior US  was 1.4cm.  Two fibroids measured.  The first 4.17 x 3.84 and second 4.06 x 3.95cm Right ovary with 3.7 x 3.2 cm hemorrhagic cyst left ovary with a 3.3 x 1.4 cm simple avascular cyst. No adnexal masses no free fluid      Blood pressure 114/68, pulse 80, last menstrual period 09/07/2023, SpO2 99%.     Component Value Date/Time   DIAGPAP - Low grade squamous intraepithelial lesion (LSIL) (A) 08/02/2023 1531   DIAGPAP  07/28/2022 1521    - Negative for intraepithelial lesion or malignancy (NILM)   DIAGPAP  07/25/2021 1556    - Negative for intraepithelial lesion or malignancy (NILM)   HPVHIGH Positive (A) 08/02/2023 1531   ADEQPAP  08/02/2023 1531    Satisfactory for evaluation; transformation zone component ABSENT.   ADEQPAP  07/28/2022 1521    Satisfactory for evaluation; transformation zone component PRESENT.   ADEQPAP  07/25/2021 1556    Satisfactory for evaluation; transformation zone component PRESENT.    GYN HISTORY:    Component Value Date/Time   DIAGPAP - Low grade squamous intraepithelial lesion (LSIL) (A) 08/02/2023 1531   DIAGPAP  07/28/2022 1521    - Negative for intraepithelial lesion or malignancy (NILM)   DIAGPAP  07/25/2021 1556    - Negative for  intraepithelial lesion or malignancy (NILM)   HPVHIGH Positive (A) 08/02/2023 1531   ADEQPAP  08/02/2023 1531    Satisfactory for evaluation; transformation zone component ABSENT.   ADEQPAP  07/28/2022 1521    Satisfactory for evaluation; transformation zone component PRESENT.   ADEQPAP  07/25/2021 1556    Satisfactory for evaluation; transformation zone component PRESENT.    OB History  Gravida Para Term Preterm AB Living  1 1 1   1   SAB IAB Ectopic Multiple Live Births      1    # Outcome Date GA Lbr Len/2nd Weight Sex Type Anes PTL Lv  1 Term     M Vag-Spont  N LIV    Past Medical History:  Diagnosis Date   Abnormal Pap smear of cervix    08-02-23 lgsil hpv hr neg 16, 18/45 neg   High risk HPV infection 11/18/2007   CIN I    Past Surgical History:  Procedure Laterality Date   GYNECOLOGIC CRYOSURGERY  11/18/2007   PELVIC LAPAROSCOPY  11/02/2008    Current Outpatient Medications on File Prior to Visit  Medication Sig Dispense Refill   Norethindrone -Ethinyl Estradiol-Fe Biphas (LO LOESTRIN FE) 1 MG-10 MCG / 10 MCG tablet Take 1 tablet by mouth daily. (Patient not taking: Reported on 10/06/2023) 84 tablet 3   No current facility-administered medications on file prior to visit.    Social History   Socioeconomic History  Marital status: Divorced    Spouse name: Not on file   Number of children: Not on file   Years of education: Not on file   Highest education level: Not on file  Occupational History   Not on file  Tobacco Use   Smoking status: Never   Smokeless tobacco: Never  Vaping Use   Vaping status: Never Used  Substance and Sexual Activity   Alcohol use: No    Alcohol/week: 0.0 standard drinks of alcohol   Drug use: No   Sexual activity: Yes    Partners: Male    Birth control/protection: None    Comment: 1ST INTERCOURSE- 57, PARTNERS- 3   Other Topics Concern   Not on file  Social History Narrative   Not on file   Social Drivers of Health    Financial Resource Strain: Not on file  Food Insecurity: Not on file  Transportation Needs: Not on file  Physical Activity: Not on file  Stress: Not on file  Social Connections: Not on file  Intimate Partner Violence: Not on file    No family history on file.   No Known Allergies    Patient's last menstrual period was Patient's last menstrual period was 09/07/2023.Alisha Morris            Review of Systems Alls systems reviewed and are negative.    from annual exam: Physical Exam Constitutional:      Appearance: Normal appearance.  Genitourinary:     Vulva and urethral meatus normal.     No lesions in the vagina.     Genitourinary Comments: Fibroids palpated     Right Labia: No rash, lesions or skin changes.    Left Labia: No lesions, skin changes or rash.    Vaginal discharge present.     No vaginal tenderness.     No vaginal prolapse present.    No vaginal atrophy present.     Right Adnexa: not tender, not palpable and no mass present.    Left Adnexa: not tender, not palpable and no mass present.    No cervical motion tenderness or discharge.     Uterus is enlarged, tender and irregular.     Uterus is anteverted.  Breasts:    Right: Normal.     Left: Normal.  HENT:     Head: Normocephalic.  Neck:     Thyroid: No thyroid mass, thyromegaly or thyroid tenderness.  Cardiovascular:     Rate and Rhythm: Normal rate and regular rhythm.     Heart sounds: Normal heart sounds, S1 normal and S2 normal.  Pulmonary:     Effort: Pulmonary effort is normal.     Breath sounds: Normal breath sounds and air entry.  Abdominal:     General: There is no distension.     Palpations: Abdomen is soft. There is no mass.     Tenderness: There is no abdominal tenderness. There is no guarding or rebound.  Musculoskeletal:        General: Normal range of motion.     Cervical back: Full passive range of motion without pain, normal range of motion and neck supple. No tenderness.     Right  lower leg: No edema.     Left lower leg: No edema.  Neurological:     Mental Status: She is alert.  Skin:    General: Skin is warm.  Psychiatric:        Mood and Affect: Mood normal.  Behavior: Behavior normal.        Thought Content: Thought content normal.  Vitals and nursing note reviewed. Exam conducted with a chaperone present.       A:        fibroids, CIN1, positive ECC with CIN1, thickened endometrial lining, menorrhagia, dysmenorrhea                             P:       Counseled on results and concerns for positive ECC with deeper level of HPV involvement in the endocervix.  Discussed importance for cone biopsy and need to remove those involved cells.  Discussed higher chance of resolution with cone biopsy but with increased chance of cervical incompetence, with future pregnancies.  She and her partner both state they do not want more children. Discussed at the time of surgery of performing the [facility] hysteroscopy to evaluate and sample the endometrial lining.  Discussed that polyps or small fibroids can be removed at this time as well.  Counseled on risk of thickened lining with polyps or precancerous or cancerous changes and with hysteroscopy sampling, this gives a more definitive sample to review with pathology and helps avoid the risk missed pathology vs. blind in office EMB. We also discussed as an option for [facility], but she would need to have an EMB collected regardless before this.  She is not ready for the hysterectomy, but would like to move forward with the [facility] hysteroscopy D&C, ECC with CKC.  The r/b/a/I of the procedure were reviewed in detail.  To send to surgery scheduling.  30 minutes spent on reviewing records, imaging,  and one on one patient time and counseling patient and documentation Dr. Glennon   No follow-ups on file.  Alisha Morris

## 2023-10-12 ENCOUNTER — Other Ambulatory Visit: Payer: Managed Care, Other (non HMO) | Admitting: Obstetrics and Gynecology

## 2023-10-12 ENCOUNTER — Other Ambulatory Visit: Payer: Managed Care, Other (non HMO)

## 2023-11-08 ENCOUNTER — Telehealth: Payer: Self-pay | Admitting: *Deleted

## 2023-11-08 NOTE — Telephone Encounter (Signed)
 08/02/23 PAP  LSIL, positive HPV, neg 16/18/45  Colpo 09/07/23, CIN 1  Patient declines surgery and CKC  See surgery referral dated 10/07/23  Dr. Tia Flowers -please review and advise.

## 2023-11-22 ENCOUNTER — Other Ambulatory Visit: Payer: Self-pay | Admitting: Obstetrics and Gynecology

## 2023-11-22 DIAGNOSIS — Z1231 Encounter for screening mammogram for malignant neoplasm of breast: Secondary | ICD-10-CM

## 2023-11-30 ENCOUNTER — Ambulatory Visit
Admission: RE | Admit: 2023-11-30 | Discharge: 2023-11-30 | Disposition: A | Discharge status: Home / Self Care | Payer: Managed Care, Other (non HMO) | Source: Ambulatory Visit | Attending: Obstetrics and Gynecology | Admitting: Obstetrics and Gynecology

## 2023-11-30 DIAGNOSIS — Z1231 Encounter for screening mammogram for malignant neoplasm of breast: Secondary | ICD-10-CM

## 2023-12-02 NOTE — Telephone Encounter (Signed)
 Patient is returning for repeat PAP at 6 mo, 03/07/24.   Encounter closed.

## 2024-03-07 ENCOUNTER — Other Ambulatory Visit (HOSPITAL_COMMUNITY)
Admission: RE | Admit: 2024-03-07 | Discharge: 2024-03-07 | Disposition: A | Discharge status: Home / Self Care | Source: Ambulatory Visit | Attending: Obstetrics and Gynecology | Admitting: Obstetrics and Gynecology

## 2024-03-07 ENCOUNTER — Ambulatory Visit: Payer: Managed Care, Other (non HMO) | Admitting: Obstetrics and Gynecology

## 2024-03-07 VITALS — BP 122/74 | HR 87 | Wt 136.8 lb

## 2024-03-07 DIAGNOSIS — R935 Abnormal findings on diagnostic imaging of other abdominal regions, including retroperitoneum: Secondary | ICD-10-CM | POA: Insufficient documentation

## 2024-03-07 DIAGNOSIS — N920 Excessive and frequent menstruation with regular cycle: Secondary | ICD-10-CM

## 2024-03-07 DIAGNOSIS — Z8742 Personal history of other diseases of the female genital tract: Secondary | ICD-10-CM | POA: Diagnosis not present

## 2024-03-07 NOTE — Progress Notes (Signed)
   Acute Office Visit  Subjective:    Patient ID: Alisha Morris, female    DOB: Jan 12, 1981, 43 y.o.   MRN: [id number]   HPI 43 y.o. presents today for Repeat pap (Colposcopy 09/07/23 CIN 1/P 08/02/23 LSIL, HR HPV positive) . Patient presents for repeat pap smear and EMB for thickened endometrial lining on PUS and menorrhagia   Patient's last menstrual period was 02/22/2024.    Review of Systems     Objective:     OBGyn Exam  BP 122/74 (BP Location: Left Arm, Patient Position: Sitting, Cuff Size: Normal)   Pulse 87   Wt 136 lb 12.8 oz (62.1 kg)   LMP 02/22/2024   SpO2 98%   BMI 28.35 kg/m  Wt Readings from Last 3 Encounters:  03/07/24 136 lb 12.8 oz (62.1 kg)  08/02/23 133 lb (60.3 kg)  07/28/22 133 lb (60.3 kg)        Sve: no lesions, cervix dilated after pap smear and culture collection. Possible BV infection. No CMT  PROCEDURE: EMB Time out performed. Consent obtained for the procedure.  A bivalve speculum was placed in the vagina.  The cervix was grasped with a single tooth tenaculum.  Pipelle was inserted and rotated.  Uterus sound to 9cm. Adequate specimen was obtained and sent to pathology.  All instruments were removed.  Patient tolerated the procedure well.  To notify patient of the results.   Assessment & Plan:  Menorrhagia CIN1, LGSIL  Thickened endometrial lining  EMB, pap smear and cultures collected.  To notify patient of the results.   Reinaldo Caras

## 2024-03-08 LAB — SURESWAB® ADVANCED VAGINITIS PLUS,TMA
C. trachomatis RNA, TMA: NOT DETECTED
CANDIDA SPECIES: NOT DETECTED
Candida glabrata: NOT DETECTED
N. gonorrhoeae RNA, TMA: NOT DETECTED
SURESWAB(R) ADV BACTERIAL VAGINOSIS(BV),TMA: NEGATIVE
TRICHOMONAS VAGINALIS (TV),TMA: NOT DETECTED

## 2024-03-09 ENCOUNTER — Ambulatory Visit: Payer: Self-pay | Admitting: Obstetrics and Gynecology

## 2024-03-09 DIAGNOSIS — R87612 Low grade squamous intraepithelial lesion on cytologic smear of cervix (LGSIL): Secondary | ICD-10-CM

## 2024-03-09 LAB — SURGICAL PATHOLOGY

## 2024-03-09 NOTE — Telephone Encounter (Signed)
 Contacted patient using [employer] [phone], Nicholes Barks interpreter ID [phone]. Left VM (ok per DPR) with normal pathology results.  Patient was instructed to call back if she had any questions.

## 2024-03-13 LAB — CYTOLOGY - PAP: Adequacy: ABSENT

## 2024-03-16 NOTE — Addendum Note (Signed)
 Addended by: Yale Held on: 03/16/2024 12:04 PM   Modules accepted: Orders

## 2024-04-13 ENCOUNTER — Encounter: Admitting: Obstetrics and Gynecology

## 2024-05-18 ENCOUNTER — Encounter: Admitting: Obstetrics and Gynecology

## 2024-06-22 ENCOUNTER — Encounter: Payer: Self-pay | Admitting: Obstetrics and Gynecology

## 2024-06-22 ENCOUNTER — Ambulatory Visit (INDEPENDENT_AMBULATORY_CARE_PROVIDER_SITE_OTHER): Admitting: Obstetrics and Gynecology

## 2024-06-22 ENCOUNTER — Other Ambulatory Visit (HOSPITAL_COMMUNITY)
Admission: RE | Admit: 2024-06-22 | Discharge: 2024-06-22 | Disposition: A | Source: Ambulatory Visit | Attending: Obstetrics and Gynecology | Admitting: Obstetrics and Gynecology

## 2024-06-22 VITALS — BP 122/82 | HR 74 | Ht <= 58 in | Wt 137.6 lb

## 2024-06-22 DIAGNOSIS — R87612 Low grade squamous intraepithelial lesion on cytologic smear of cervix (LGSIL): Secondary | ICD-10-CM

## 2024-06-22 NOTE — Addendum Note (Signed)
 Addended by: GLENNON ALMARIE POUR on: 06/22/2024 01:51 PM   Modules accepted: Orders

## 2024-06-22 NOTE — Progress Notes (Addendum)
 Colposcopy Procedure Note [name] 06/22/2024  Indications: LGSIL  Time out performed Procedure Details  The risks and benefits of the procedure and Written informed consent obtained.  Speculum placed in vagina and excellent visualization of cervix achieved, cervix swabbed x 3 with acetic acid solution.  Impression:CIN1  Satisfactory( ECC zone seen): yes  Findings:  Cervix colposcopy biopsy taken: 10 O'clock for acetowhite changes   ECC performed with cytobrush  Hemostasis obtained with application of Monsel's Solution    Complications:    Patient tolerated the procedure well  Plan:  To notify patient in epic and phone call of results and plan of care Discussed she must have two consecutive normal pap smears Q57months before returning to annual pap smears   Alisha Morris Carpen

## 2024-06-27 ENCOUNTER — Ambulatory Visit: Payer: Self-pay | Admitting: Obstetrics and Gynecology

## 2024-06-27 LAB — SURGICAL PATHOLOGY

## 2024-10-02 DIAGNOSIS — Z1231 Encounter for screening mammogram for malignant neoplasm of breast: Secondary | ICD-10-CM

## 2024-12-01 ENCOUNTER — Ambulatory Visit

## 2024-12-25 ENCOUNTER — Ambulatory Visit: Admitting: Obstetrics and Gynecology
